# Patient Record
Sex: Female | Born: 1950 | Race: White | Hispanic: No | Marital: Married | Smoking: Former smoker
Health system: Southern US, Community
[De-identification: ages and names within clinical notes are randomized; demographics above are authoritative.]

## PROBLEM LIST (undated history)

## (undated) DIAGNOSIS — I6529 Occlusion and stenosis of unspecified carotid artery: Secondary | ICD-10-CM

## (undated) DIAGNOSIS — E079 Disorder of thyroid, unspecified: Secondary | ICD-10-CM

## (undated) DIAGNOSIS — E785 Hyperlipidemia, unspecified: Secondary | ICD-10-CM

## (undated) DIAGNOSIS — I4891 Unspecified atrial fibrillation: Secondary | ICD-10-CM

## (undated) DIAGNOSIS — I480 Paroxysmal atrial fibrillation: Secondary | ICD-10-CM

## (undated) DIAGNOSIS — F329 Major depressive disorder, single episode, unspecified: Secondary | ICD-10-CM

## (undated) DIAGNOSIS — Z79891 Long term (current) use of opiate analgesic: Secondary | ICD-10-CM

## (undated) DIAGNOSIS — G894 Chronic pain syndrome: Secondary | ICD-10-CM

## (undated) DIAGNOSIS — L02612 Cutaneous abscess of left foot: Secondary | ICD-10-CM

## (undated) DIAGNOSIS — J189 Pneumonia, unspecified organism: Secondary | ICD-10-CM

## (undated) DIAGNOSIS — M5137 Other intervertebral disc degeneration, lumbosacral region: Secondary | ICD-10-CM

## (undated) DIAGNOSIS — I82409 Acute embolism and thrombosis of unspecified deep veins of unspecified lower extremity: Secondary | ICD-10-CM

## (undated) DIAGNOSIS — I10 Essential (primary) hypertension: Secondary | ICD-10-CM

## (undated) DIAGNOSIS — A419 Sepsis, unspecified organism: Secondary | ICD-10-CM

## (undated) DIAGNOSIS — M51379 Other intervertebral disc degeneration, lumbosacral region without mention of lumbar back pain or lower extremity pain: Secondary | ICD-10-CM

## (undated) DIAGNOSIS — Z9289 Personal history of other medical treatment: Secondary | ICD-10-CM

## (undated) DIAGNOSIS — Z9049 Acquired absence of other specified parts of digestive tract: Secondary | ICD-10-CM

## (undated) DIAGNOSIS — M961 Postlaminectomy syndrome, not elsewhere classified: Secondary | ICD-10-CM

## (undated) DIAGNOSIS — G4733 Obstructive sleep apnea (adult) (pediatric): Secondary | ICD-10-CM

## (undated) DIAGNOSIS — I509 Heart failure, unspecified: Secondary | ICD-10-CM

## (undated) DIAGNOSIS — F32A Depression, unspecified: Secondary | ICD-10-CM

## (undated) DIAGNOSIS — M5030 Other cervical disc degeneration, unspecified cervical region: Secondary | ICD-10-CM

## (undated) HISTORY — DX: Paroxysmal atrial fibrillation: I48.0

## (undated) HISTORY — PX: CARDIAC CATHETERIZATION: SHX172

## (undated) HISTORY — DX: Major depressive disorder, single episode, unspecified: F32.9

## (undated) HISTORY — PX: COLONOSCOPY, ESOPHAGOGASTRODUODENOSCOPY (EGD) AND ESOPHAGEAL DILATION: SHX5781

## (undated) HISTORY — PX: TONSILLECTOMY: SUR1361

## (undated) HISTORY — DX: Hyperlipidemia, unspecified: E78.5

## (undated) HISTORY — DX: Long term (current) use of opiate analgesic: Z79.891

## (undated) HISTORY — PX: LUMBAR MICRODISCECTOMY: SHX99

## (undated) HISTORY — DX: Depression, unspecified: F32.A

## (undated) HISTORY — DX: Sepsis, unspecified organism: A41.9

## (undated) HISTORY — DX: Essential (primary) hypertension: I10

## (undated) HISTORY — DX: Obstructive sleep apnea (adult) (pediatric): G47.33

## (undated) HISTORY — DX: Unspecified atrial fibrillation: I48.91

## (undated) HISTORY — PX: COLONOSCOPY WITH ESOPHAGOGASTRODUODENOSCOPY (EGD): SHX5779

## (undated) HISTORY — DX: Other cervical disc degeneration, unspecified cervical region: M50.30

## (undated) HISTORY — DX: Postlaminectomy syndrome, not elsewhere classified: M96.1

## (undated) HISTORY — DX: Acute embolism and thrombosis of unspecified deep veins of unspecified lower extremity: I82.409

## (undated) HISTORY — DX: Acquired absence of other specified parts of digestive tract: Z90.49

## (undated) HISTORY — DX: Other intervertebral disc degeneration, lumbosacral region without mention of lumbar back pain or lower extremity pain: M51.379

## (undated) HISTORY — PX: BREAST LUMPECTOMY: SHX2

## (undated) HISTORY — DX: Disorder of thyroid, unspecified: E07.9

## (undated) HISTORY — DX: Personal history of other medical treatment: Z92.89

## (undated) HISTORY — PX: EVACUATION BREAST HEMATOMA: SHX1537

## (undated) HISTORY — DX: Occlusion and stenosis of unspecified carotid artery: I65.29

## (undated) HISTORY — DX: Cutaneous abscess of left foot: L02.612

## (undated) HISTORY — DX: Heart failure, unspecified: I50.9

## (undated) HISTORY — DX: Chronic pain syndrome: G89.4

## (undated) HISTORY — DX: Other intervertebral disc degeneration, lumbosacral region: M51.37

## (undated) HISTORY — PX: CARDIAC PACEMAKER PLACEMENT: SHX583

## (undated) HISTORY — PX: BACK SURGERY: SHX140

## (undated) HISTORY — DX: Pneumonia, unspecified organism: J18.9

---

## 2007-01-06 ENCOUNTER — Encounter: Admission: RE | Admit: 2007-01-06 | Discharge: 2007-01-06 | Payer: Self-pay | Admitting: Obstetrics and Gynecology

## 2007-01-09 ENCOUNTER — Encounter: Admission: RE | Admit: 2007-01-09 | Discharge: 2007-01-09 | Payer: Self-pay | Admitting: Obstetrics and Gynecology

## 2010-06-04 ENCOUNTER — Encounter: Payer: Self-pay | Admitting: Family Medicine

## 2010-06-04 ENCOUNTER — Encounter: Payer: Self-pay | Admitting: Obstetrics and Gynecology

## 2010-06-04 ENCOUNTER — Encounter: Payer: Self-pay | Admitting: Internal Medicine

## 2016-05-04 ENCOUNTER — Other Ambulatory Visit: Payer: Self-pay | Admitting: Orthopedic Surgery

## 2016-05-04 DIAGNOSIS — M4316 Spondylolisthesis, lumbar region: Secondary | ICD-10-CM

## 2016-05-04 DIAGNOSIS — M961 Postlaminectomy syndrome, not elsewhere classified: Secondary | ICD-10-CM

## 2016-05-14 DIAGNOSIS — I6529 Occlusion and stenosis of unspecified carotid artery: Secondary | ICD-10-CM

## 2016-05-14 HISTORY — DX: Occlusion and stenosis of unspecified carotid artery: I65.29

## 2016-05-18 ENCOUNTER — Other Ambulatory Visit: Payer: Self-pay

## 2016-05-18 ENCOUNTER — Inpatient Hospital Stay: Admission: RE | Admit: 2016-05-18 | Payer: Self-pay | Source: Ambulatory Visit

## 2016-05-28 ENCOUNTER — Other Ambulatory Visit: Payer: Self-pay

## 2016-06-02 ENCOUNTER — Ambulatory Visit
Admission: RE | Admit: 2016-06-02 | Discharge: 2016-06-02 | Disposition: A | Discharge status: Home / Self Care | Payer: Self-pay | Source: Ambulatory Visit | Attending: Orthopedic Surgery | Admitting: Orthopedic Surgery

## 2016-06-02 DIAGNOSIS — M961 Postlaminectomy syndrome, not elsewhere classified: Secondary | ICD-10-CM

## 2016-06-02 DIAGNOSIS — M4316 Spondylolisthesis, lumbar region: Secondary | ICD-10-CM

## 2017-08-16 IMAGING — MR MR LUMBAR SPINE W/O CM
5 series · 40 of 48 positions shown · non-contrast
Comparison: 02/09/2011

CLINICAL DATA: Chronic central and right-sided low back pain
radiating into the right leg to the foot. Prior surgery.

EXAM:
MRI LUMBAR SPINE WITHOUT CONTRAST
TECHNIQUE: Multiplanar, multisequence MR imaging of the lumbar spine was
performed. No intravenous contrast was administered.

[Series 3: T2 · sagittal · 4.0mm · 0.81mm/px · 7 of 14 slices shown (1 of 2)]
[im 1/14]
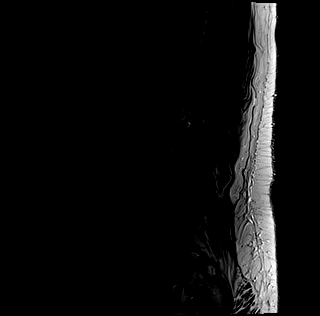
[im 3/14]
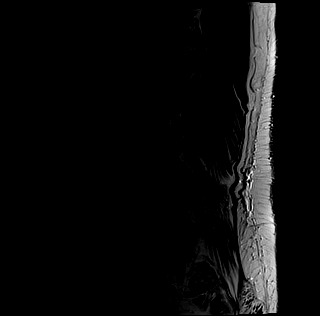
[im 5/14]
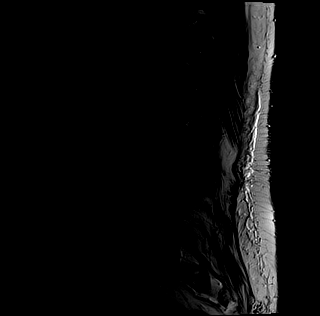
[im 7/14]
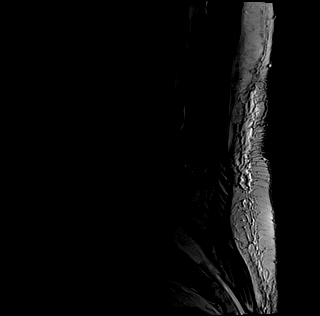
[im 9/14]
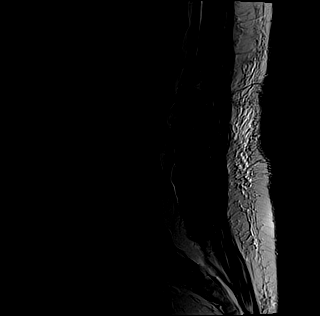
[im 11/14]
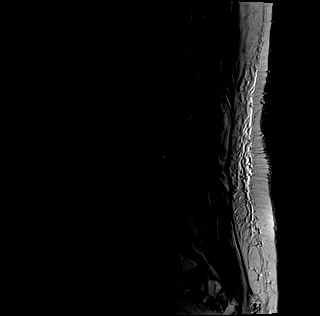
[im 14/14]
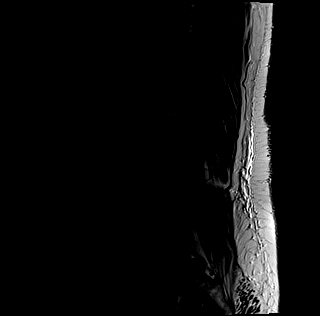

[Series 4: tirm sag · sagittal · 4.0mm · 0.55mm/px · 7 of 14 slices shown]
[im 1/14]
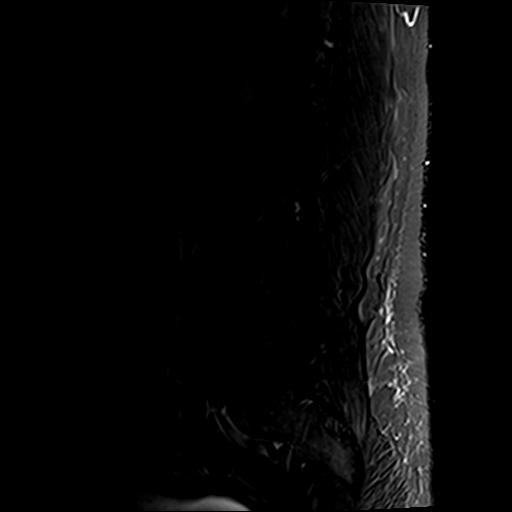
[im 3/14]
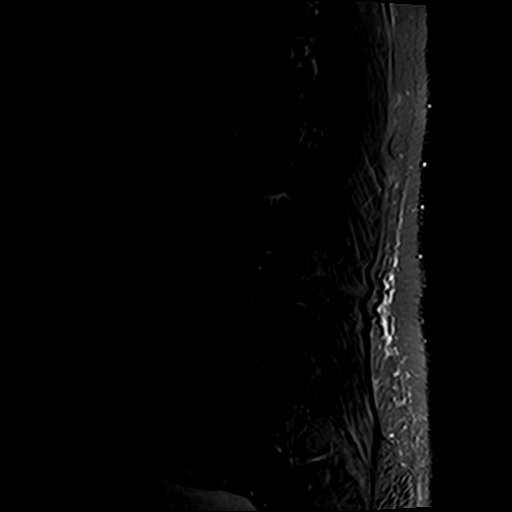
[im 5/14]
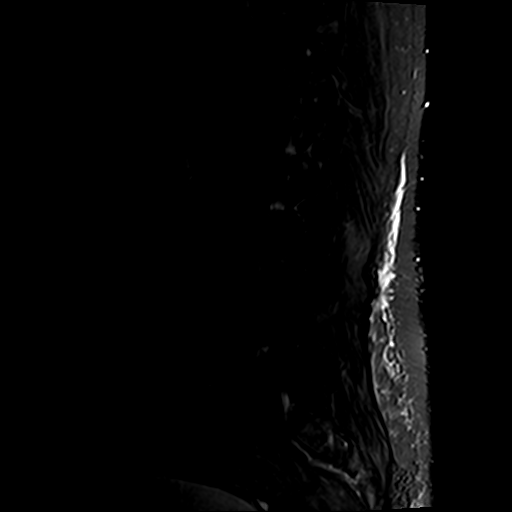
[im 7/14]
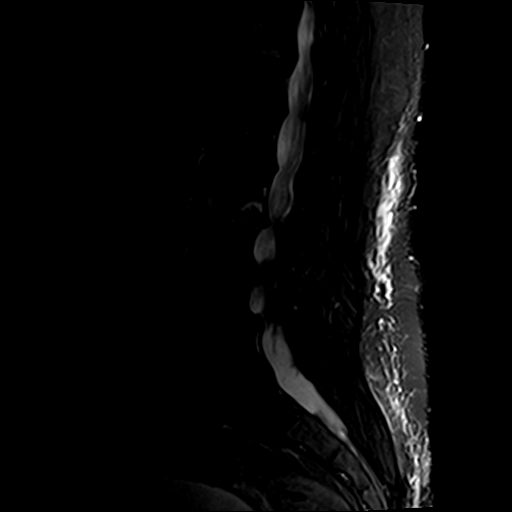
[im 9/14]
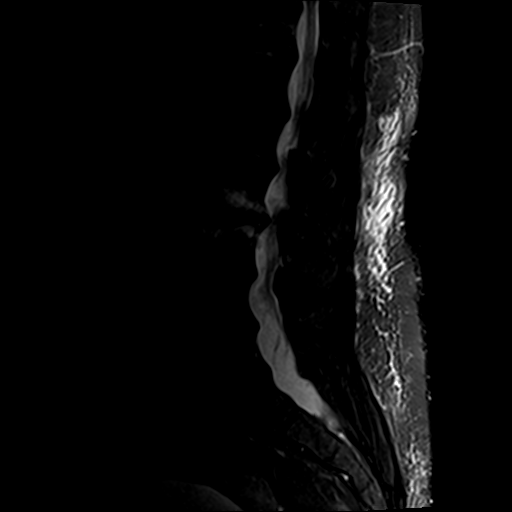
[im 11/14]
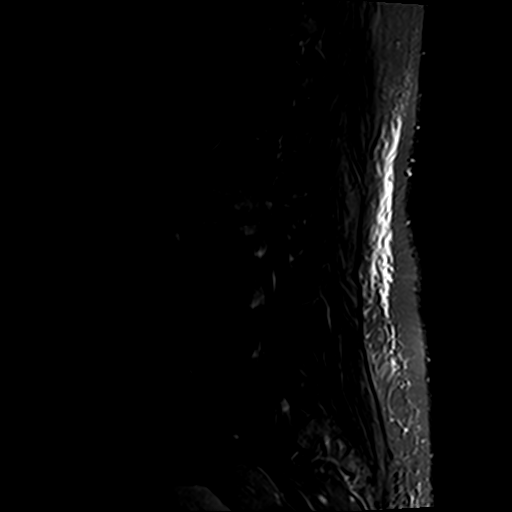
[im 14/14]
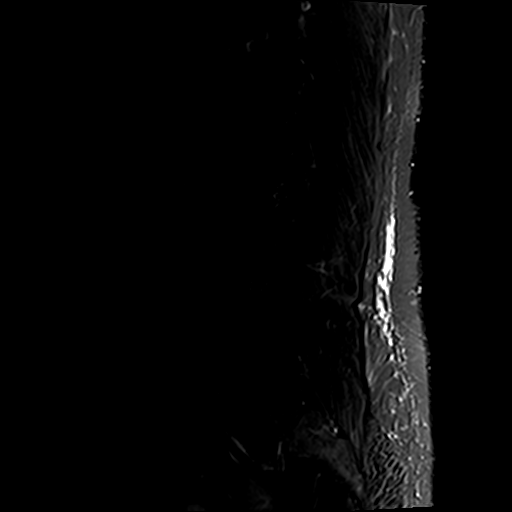

[Series 5: T1 · sagittal · 4.0mm · 0.88mm/px · 6 of 14 slices shown (1 of 2)]
[im 1/14]
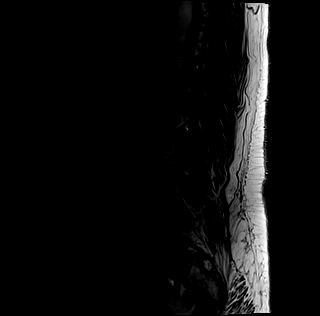
[im 3/14]
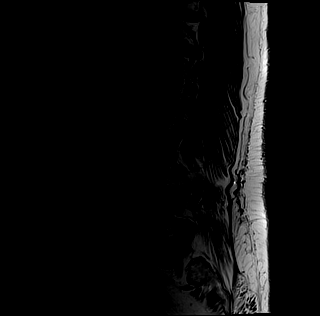
[im 6/14]
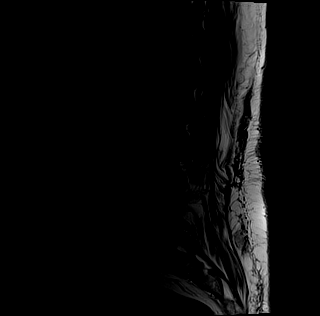
[im 8/14]
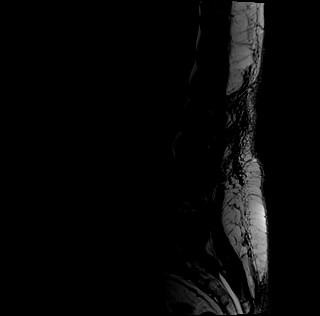
[im 11/14]
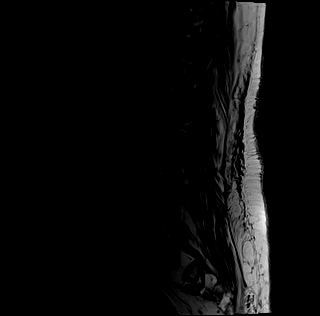
[im 14/14]
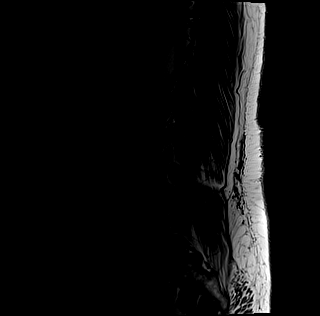

[Series 7: T2 · axial · 4.0mm · 0.70mm/px · z∈[-102,+69]mm · 12 of 31 slices shown (2 of 2)]
[im 1/31]
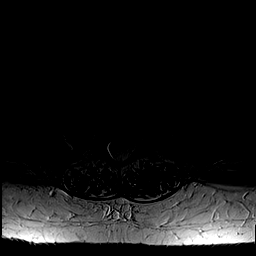
[im 3/31]
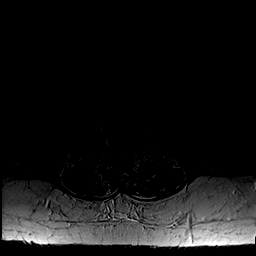
[im 5/31]
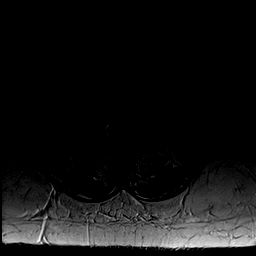
[im 7/31]
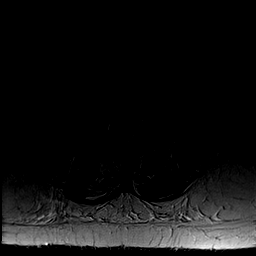
[im 10/31]
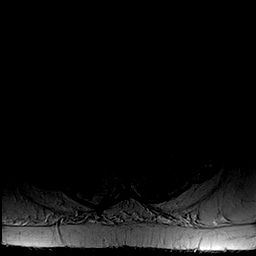
[im 12/31]
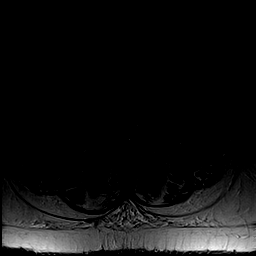
[im 14/31]
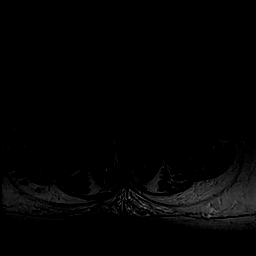
[im 17/31]
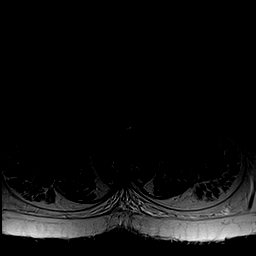
[im 19/31]
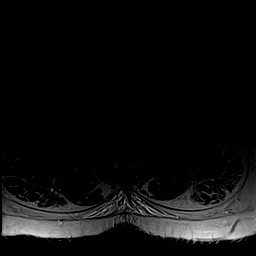
[im 21/31]
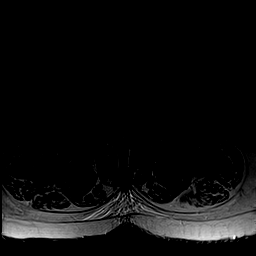
[im 26/31]
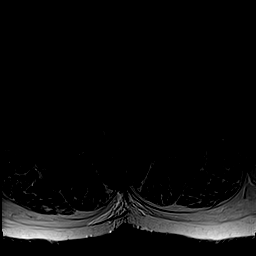
[im 31/31]
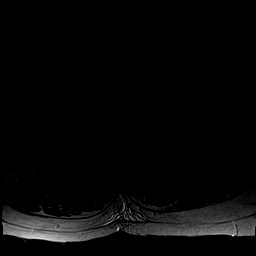

[Series 8: T1 · axial · 4.0mm · 0.70mm/px · z∈[-102,+69]mm · 8 of 31 slices shown (2 of 2)]
[im 1/31]
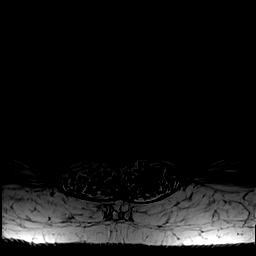
[im 5/31]
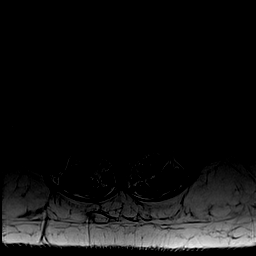
[im 10/31]
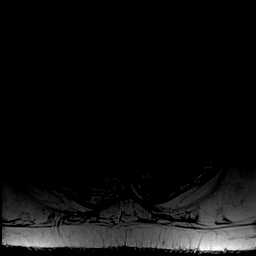
[im 14/31]
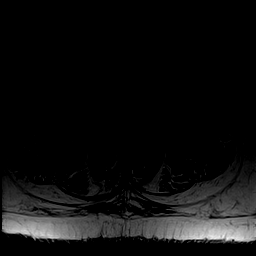
[im 17/31]
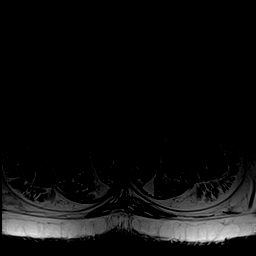
[im 21/31]
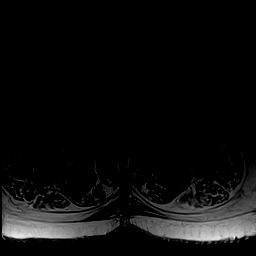
[im 26/31]
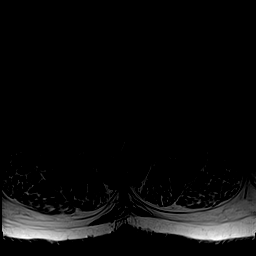
[im 31/31]
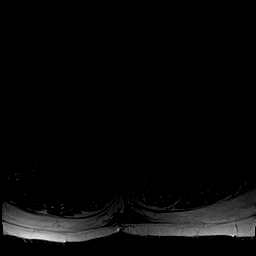

[40 of 48 positions shown; findings below may reference images not displayed]

FINDINGS: Segmentation:  Standard.

Alignment: Trace retrolisthesis of L2 on L3 and L3 on L4 and trace
anterolisthesis of L4 on L5, degenerative in appearance.

Vertebrae: No fracture or suspicious osseous lesion. Progressive
disc degeneration at L2-3 with new moderate degenerative edema.
Predominantly type 2 endplate changes at L4-5. Scattered small
Schmorl's nodes.

Conus medullaris: Extends to the L1 level and appears normal.

Paraspinal and other soft tissues: No acute abnormality.

Disc levels:

Disc desiccation throughout the lumbar spine. Moderate disc space
narrowing at L2-3 and L3-4 and severe narrowing at L4-5.

T11-12 and T12-L1: Only imaged sagittally. Mild disc bulging at both
levels without significant stenosis.

L1-2: Circumferential disc bulging and mild facet arthrosis result
in minimal left neural foraminal narrowing without spinal stenosis,
unchanged.

L2-3: Circumferential disc bulging, endplate spurring, mild facet
and ligamentum flavum hypertrophy, and prominent dorsal epidural fat
result in moderate spinal stenosis and moderate left and mild right
neural foraminal stenosis, overall mildly progressed.

L3-4: Circumferential disc bulging and mild facet hypertrophy result
in mild bilateral lateral recess and mild bilateral neural foraminal
stenosis, not significantly changed.

L4-5: Prior right laminectomy again noted. Disc bulging, endplate
spurring, small right paracentral disc protrusion, and moderate
facet arthrosis result in mild right lateral recess and moderate
right greater than left neural foraminal stenosis, not significantly
changed. No spinal stenosis.

L5-S1: Moderate facet arthrosis without disc herniation or stenosis,
unchanged.
IMPRESSION: 1. Progressive disc degeneration at L2-3 with moderate spinal
stenosis and moderate left neural foraminal stenosis.
2. Unchanged disc and facet degeneration elsewhere with up to
moderate neural foraminal stenosis as above.

## 2018-03-26 ENCOUNTER — Non-Acute Institutional Stay (SKILLED_NURSING_FACILITY): Payer: Medicare HMO | Admitting: Internal Medicine

## 2018-03-26 ENCOUNTER — Encounter: Payer: Self-pay | Admitting: Internal Medicine

## 2018-03-26 DIAGNOSIS — L03032 Cellulitis of left toe: Secondary | ICD-10-CM

## 2018-03-26 DIAGNOSIS — I4891 Unspecified atrial fibrillation: Secondary | ICD-10-CM

## 2018-03-26 DIAGNOSIS — L02612 Cutaneous abscess of left foot: Secondary | ICD-10-CM

## 2018-03-26 DIAGNOSIS — I10 Essential (primary) hypertension: Secondary | ICD-10-CM

## 2018-03-26 DIAGNOSIS — G894 Chronic pain syndrome: Secondary | ICD-10-CM

## 2018-03-26 DIAGNOSIS — K8020 Calculus of gallbladder without cholecystitis without obstruction: Secondary | ICD-10-CM | POA: Diagnosis not present

## 2018-03-26 DIAGNOSIS — E034 Atrophy of thyroid (acquired): Secondary | ICD-10-CM

## 2018-03-26 DIAGNOSIS — E785 Hyperlipidemia, unspecified: Secondary | ICD-10-CM

## 2018-03-26 NOTE — Progress Notes (Signed)
:   Location:  [facility] Room Number: [phone] Place of Service:  SNF (31)  Crystal Gates. Lyn Hollingshead, MD  Extended Emergency Contact Information Primary Emergency Contact: Pooler,Glenn Address: [address]          [place], [place] [address] D[place] of [place] Home Phone: [phone] Mobile Phone: [phone] Relation: Other     Allergies: Patient has no known allergies.  Chief Complaint  Patient presents with  . New Admit To SNF    Admit to [facility]    HPI: Patient is 67 y.o. female with paroxysmal atrial fib, history of tachybradycardia post pacemaker placement in 03/13/2018, history of left foot abscess post I&D on oral antibiotics, history of carotid artery stenosis, chronic back pain, post lumbar laminectomy and fusion who presented to the emergency department with complaints of constant right flank pain that it started the night prior.  She came to the hospital thinking she had a kidney stone.  She denied any chest pain, shortness of breath, any vomiting diarrhea or dysuria.  CT abdomen and pelvis did not show any significant findings, ultrasound of the right upper quadrant showed patient had 2 cm gallstone at gallbladder neck and suspected gallbladder sludge without sonographic findings of acute cholecystitis.  Patient admitted to [facility] from 11/6-12 where she underwent her laparoscopic cholecystectomy.  Since she was n.p.o. and did not take her medication she went into atrial fib with RVR.  She was started on Cardizem drip then metoprolol resumed.  Patient's left foot injury was seen by Ortho who felt the wound had healed and recommended nonweightbearing exercise for 2 more weeks.  Patient is admitted to SNF for OT/PT.  While at SNF patient will be followed for hypertension treated with Cozaar and metoprolol, hyperlipidemia treated with Lipitor and hypothyroidism treated with Synthroid.  Past Medical History:  Diagnosis Date  .  Carotid artery stenosis 2018   Left 40-59%, Right 1-39%  . CHF (congestive heart failure) (HCC)   . Chronic pain syndrome   . Chronic prescription opiate use   . Degeneration of cervical intervertebral disc   . Degeneration of lumbar or lumbosacral intervertebral disc   . Depressive disorder   . DVT (deep venous thrombosis) (HCC)   . Hyperlipidemia   . Hypertension   . OSA (obstructive sleep apnea)    No CPAP  . Paroxysmal atrial fibrillation (HCC)   . Pneumonia   . Postlaminectomy syndrome, lumbar region   . Sepsis (HCC)   . Thyroid disease   . Transfusion history     Past Surgical History:  Procedure Laterality Date  . BACK SURGERY    . CARDIAC CATHETERIZATION    . COLONOSCOPY, ESOPHAGOGASTRODUODENOSCOPY (EGD) AND ESOPHAGEAL DILATION    . LUMBAR MICRODISCECTOMY    . TONSILLECTOMY      Allergies as of 03/26/2018   No Known Allergies     Medication List        Accurate as of 03/26/18  2:07 PM. Always use your most recent med list.          acetaminophen 500 MG tablet Commonly known as:  TYLENOL Take 1,000 mg by mouth every 6 (six) hours as needed.   aspirin 81 MG chewable tablet Chew by mouth daily.   atorvastatin 20 MG tablet Commonly known as:  LIPITOR Take by mouth daily. 1 Tablet (20 mg total) by mouth daily at 6p   desvenlafaxine 50 MG 24 hr tablet Commonly known  as:  PRISTIQ Take 100 mg by mouth at bedtime.   docusate sodium 100 MG capsule Commonly known as:  COLACE Take 100 mg by mouth 2 (two) times daily.   HYDROcodone-acetaminophen 5-325 MG tablet Commonly known as:  NORCO/VICODIN Take 2 tablets by mouth every 6 (six) hours as needed for moderate pain.   levothyroxine 150 MCG tablet Commonly known as:  SYNTHROID, LEVOTHROID Take 150 mcg by mouth daily before breakfast.   losartan 25 MG tablet Commonly known as:  COZAAR Take 12.5 mg by mouth daily.   metoprolol tartrate 25 MG tablet Commonly known as:  LOPRESSOR Take 25 mg by mouth 2  (two) times daily.   morphine 15 MG 12 hr tablet Commonly known as:  MS CONTIN Take 15 mg by mouth every 8 (eight) hours.   pregabalin 200 MG capsule Commonly known as:  LYRICA Take 200 mg by mouth 3 (three) times daily.   rivaroxaban 20 MG Tabs tablet Commonly known as:  XARELTO Take 20 mg by mouth daily with supper.       No orders of the defined types were placed in this encounter.    There is no immunization history on file for this patient.  Social History   Tobacco Use  . Smoking status: Former Games developer  . Smokeless tobacco: Former Engineer, water Use Topics  . Alcohol use: Not on file    Family history is   Family History  Problem Relation Age of Onset  . Breast cancer Mother 32      Review of Systems  DATA OBTAINED: from patient, nurse GENERAL:  no fevers, fatigue, appetite changes SKIN: No itching, or rash EYES: No eye pain, redness, discharge EARS: No earache, tinnitus, change in hearing NOSE: No congestion, drainage or bleeding  MOUTH/THROAT: No mouth or tooth pain, No sore throat RESPIRATORY: No cough, wheezing, SOB CARDIAC: No chest pain, palpitations, lower extremity edema  GI: No abdominal pain, No N/V/D or constipation, No heartburn or reflux  GU: No dysuria, frequency or urgency, or incontinence  MUSCULOSKELETAL: No unrelieved bone/joint pain NEUROLOGIC: No headache, dizziness or focal weakness PSYCHIATRIC: No c/o anxiety or sadness   Vitals:   03/26/18 1405  BP: 133/72  Pulse: 71  Resp: 18  Temp: (!) 97.4 F (36.3 C)    SpO2 Readings from Last 1 Encounters:  No data found for SpO2   Body mass index is 35.19 kg/m.     Physical Exam  GENERAL APPEARANCE: Alert, conversant,  No acute distress.  SKIN: No diaphoresis rash HEAD: Normocephalic, atraumatic  EYES: Conjunctiva/lids clear. Pupils round, reactive. EOMs intact.  EARS: External exam WNL, canals clear. Hearing grossly normal.  NOSE: No deformity or discharge.    MOUTH/THROAT: Lips w/o lesions  RESPIRATORY: Breathing is even, unlabored. Lung sounds are clear   CARDIOVASCULAR: Heart RRR no murmurs, rubs or gallops. No peripheral edema.   GASTROINTESTINAL: Abdomen is soft, mild right upper quadrant tenderness, incision areas look clean, tender, not distended w/ normal bowel sounds. GENITOURINARY: Bladder non tender, not distended  MUSCULOSKELETAL: No abnormal joints or musculature NEUROLOGIC:  Cranial nerves 2-12 grossly intact. Moves all extremities  PSYCHIATRIC: Mood and affect appropriate to situation, no behavioral issues  There are no active problems to display for this patient.     Labs reviewed: Basic Metabolic Panel: No results found for: NA, K, CL, CO2, GLUCOSE, BUN, CREATININE, CALCIUM, PROT, ALBUMIN, AST, ALT, ALKPHOS, BILITOT, GFRNONAA, GFRAA  No results for input(s): NA, K, CL, CO2, GLUCOSE, BUN,  CREATININE, CALCIUM, MG, PHOS in the last 8760 hours. Liver Function Tests: No results for input(s): AST, ALT, ALKPHOS, BILITOT, PROT, ALBUMIN in the last 8760 hours. No results for input(s): LIPASE, AMYLASE in the last 8760 hours. No results for input(s): AMMONIA in the last 8760 hours. CBC: No results for input(s): WBC, NEUTROABS, HGB, HCT, MCV, PLT in the last 8760 hours. Lipid No results for input(s): CHOL, HDL, LDLCALC, TRIG in the last 8760 hours.  Cardiac Enzymes: No results for input(s): CKTOTAL, CKMB, CKMBINDEX, TROPONINI in the last 8760 hours. BNP: No results for input(s): BNP in the last 8760 hours. No results found for: MICROALBUR No results found for: HGBA1C No results found for: TSH No results found for: VITAMINB12 No results found for: FOLATE No results found for: IRON, TIBC, FERRITIN  Imaging and Procedures obtained prior to SNF admission: Mr Thoracic Spine Wo Contrast  Result Date: 06/02/2016 CLINICAL DATA:  Thoracic post-laminectomy syndrome. Worsening low back pain. EXAM: MRI THORACIC SPINE WITHOUT CONTRAST  TECHNIQUE: Multiplanar, multisequence MR imaging of the thoracic spine was performed. No intravenous contrast was administered. COMPARISON:  Chest CT 09/29/2014.  CT abdomen and pelvis 12/27/2014. FINDINGS: Alignment: Slight S-shaped thoracic scoliosis, convex left in the lower thoracic spine. No listhesis. Vertebrae: No evidence of fracture, osseous lesion, or significant marrow edema. Multiple small Schmorl's nodes are present throughout the mid and lower thoracic spine. Cord:  Normal signal and morphology. Paraspinal and other soft tissues: 2 cm stone in the gallbladder common bile duct dilatation up to 12 mm, incompletely visualized and potentially mildly increased compared to the prior abdominal CT. Disc levels: Partially visualized disc degeneration at C6-7 with broad-based posterior disc osteophyte complex resulting in likely mild spinal stenosis. There is mild disc bulging from T5-6 to T12-L1, greatest at T12-L1 where there is minimal left lateral recess narrowing without spinal stenosis, neural foraminal stenosis, or evidence of neural impingement. Spinal canal and neural foramina are widely patent elsewhere in the thoracic spine. IMPRESSION: Mild thoracic spondylosis without evidence of neural impingement. Electronically Signed   By: Sebastian AcheAllen  Grady M.D.   On: 06/02/2016 18:52   Mr Lumbar Spine Wo Contrast  Result Date: 06/02/2016 CLINICAL DATA:  Chronic central and right-sided low back pain radiating into the right leg to the foot. Prior surgery. EXAM: MRI LUMBAR SPINE WITHOUT CONTRAST TECHNIQUE: Multiplanar, multisequence MR imaging of the lumbar spine was performed. No intravenous contrast was administered. COMPARISON:  02/09/2011 FINDINGS: Segmentation:  Standard. Alignment: Trace retrolisthesis of L2 on L3 and L3 on L4 and trace anterolisthesis of L4 on L5, degenerative in appearance. Vertebrae: No fracture or suspicious osseous lesion. Progressive disc degeneration at L2-3 with new moderate  degenerative edema. Predominantly type 2 endplate changes at L4-5. Scattered small Schmorl's nodes. Conus medullaris: Extends to the L1 level and appears normal. Paraspinal and other soft tissues: No acute abnormality. Disc levels: Disc desiccation throughout the lumbar spine. Moderate disc space narrowing at L2-3 and L3-4 and severe narrowing at L4-5. T11-12 and T12-L1: Only imaged sagittally. Mild disc bulging at both levels without significant stenosis. L1-2: Circumferential disc bulging and mild facet arthrosis result in minimal left neural foraminal narrowing without spinal stenosis, unchanged. L2-3: Circumferential disc bulging, endplate spurring, mild facet and ligamentum flavum hypertrophy, and prominent dorsal epidural fat result in moderate spinal stenosis and moderate left and mild right neural foraminal stenosis, overall mildly progressed. L3-4: Circumferential disc bulging and mild facet hypertrophy result in mild bilateral lateral recess and mild bilateral neural foraminal stenosis, not  significantly changed. L4-5: Prior right laminectomy again noted. Disc bulging, endplate spurring, small right paracentral disc protrusion, and moderate facet arthrosis result in mild right lateral recess and moderate right greater than left neural foraminal stenosis, not significantly changed. No spinal stenosis. L5-S1: Moderate facet arthrosis without disc herniation or stenosis, unchanged. IMPRESSION: 1. Progressive disc degeneration at L2-3 with moderate spinal stenosis and moderate left neural foraminal stenosis. 2. Unchanged disc and facet degeneration elsewhere with up to moderate neural foraminal stenosis as above. Electronically Signed   By: Sebastian Ache M.D.   On: 06/02/2016 18:46     Not all labs, radiology exams or other studies done during hospitalization come through on my EPIC note; however they are reviewed by me.    Assessment and Plan  Impacted gallstone- status post cholecystectomy; doing  well SNF- admitted for OT/PT  Left foot infection- status post I&D with improvement of cellulitis after 3 weeks of Omnicef ending on 11/9; patient did continue 2 weeks of nonweightbearing SNF- will be followed by PT with 2 weeks of nonweightbearing left foot  Atrial fibrillation with RVR- secondary to n.p.o. status without meds; treated with diltiazem drip and then back to metoprolol SNF- continue metoprolol 25 mg twice daily and Xarelto 20 mg daily  Hypertension SNF-controlled continue Cozaar 12.5 mg daily and metoprolol 25 mg twice daily  Hyperlipidemia SNF- not stated as uncontrolled; continue Lipitor 20 mg daily  Hypothyroidism SNF- not stated as uncontrolled continue Synthroid 150 mcg daily  Chronic pain syndrome- patient was started on morphine extended release 15 mg every 8 hours with Norco 10 mg every 6 for breakthrough pain SNF- continue above regimen; patient should follow-up pain clinic on discharge   Time spent greater than 45 minutes;> 50% of time with patient was spent reviewing records, labs, tests and studies, counseling and developing plan of care  Thurston Hole D. Lyn Hollingshead, MD

## 2018-03-29 ENCOUNTER — Encounter: Payer: Self-pay | Admitting: Internal Medicine

## 2018-03-29 DIAGNOSIS — K8020 Calculus of gallbladder without cholecystitis without obstruction: Secondary | ICD-10-CM | POA: Insufficient documentation

## 2018-03-29 DIAGNOSIS — G894 Chronic pain syndrome: Secondary | ICD-10-CM | POA: Insufficient documentation

## 2018-03-29 DIAGNOSIS — I4891 Unspecified atrial fibrillation: Secondary | ICD-10-CM | POA: Insufficient documentation

## 2018-03-29 DIAGNOSIS — L02612 Cutaneous abscess of left foot: Secondary | ICD-10-CM | POA: Insufficient documentation

## 2018-03-29 DIAGNOSIS — E785 Hyperlipidemia, unspecified: Secondary | ICD-10-CM | POA: Insufficient documentation

## 2018-03-29 DIAGNOSIS — L03032 Cellulitis of left toe: Secondary | ICD-10-CM

## 2018-03-29 DIAGNOSIS — E039 Hypothyroidism, unspecified: Secondary | ICD-10-CM | POA: Insufficient documentation

## 2018-03-29 DIAGNOSIS — I10 Essential (primary) hypertension: Secondary | ICD-10-CM | POA: Insufficient documentation

## 2018-03-31 LAB — BASIC METABOLIC PANEL
BUN: 12 (ref 4–21)
CREATININE: 0.7 (ref 0.5–1.1)
Glucose: 96
POTASSIUM: 4.2 (ref 3.4–5.3)
SODIUM: 141 (ref 137–147)

## 2018-03-31 LAB — CBC AND DIFFERENTIAL
HEMATOCRIT: 34 — AB (ref 36–46)
Hemoglobin: 11.7 — AB (ref 12.0–16.0)
Platelets: 169 (ref 150–399)
WBC: 5.7

## 2018-04-04 ENCOUNTER — Encounter: Payer: Self-pay | Admitting: Internal Medicine

## 2018-04-04 ENCOUNTER — Non-Acute Institutional Stay (SKILLED_NURSING_FACILITY): Payer: Medicare HMO | Admitting: Internal Medicine

## 2018-04-04 ENCOUNTER — Other Ambulatory Visit: Payer: Self-pay | Admitting: Internal Medicine

## 2018-04-04 DIAGNOSIS — E785 Hyperlipidemia, unspecified: Secondary | ICD-10-CM

## 2018-04-04 DIAGNOSIS — K8020 Calculus of gallbladder without cholecystitis without obstruction: Secondary | ICD-10-CM

## 2018-04-04 DIAGNOSIS — L03032 Cellulitis of left toe: Secondary | ICD-10-CM

## 2018-04-04 DIAGNOSIS — L02612 Cutaneous abscess of left foot: Secondary | ICD-10-CM

## 2018-04-04 DIAGNOSIS — I4891 Unspecified atrial fibrillation: Secondary | ICD-10-CM

## 2018-04-04 DIAGNOSIS — G894 Chronic pain syndrome: Secondary | ICD-10-CM

## 2018-04-04 DIAGNOSIS — I10 Essential (primary) hypertension: Secondary | ICD-10-CM

## 2018-04-04 DIAGNOSIS — E034 Atrophy of thyroid (acquired): Secondary | ICD-10-CM

## 2018-04-04 MED ORDER — ATORVASTATIN CALCIUM 20 MG PO TABS: 20.0000 mg | ORAL_TABLET | Freq: Every day | ORAL | 0 refills | Status: DC

## 2018-04-04 MED ORDER — ASPIRIN 81 MG PO CHEW: 81.0000 mg | CHEWABLE_TABLET | Freq: Every day | ORAL | 0 refills | Status: AC

## 2018-04-04 MED ORDER — RIVAROXABAN 20 MG PO TABS: 20.0000 mg | ORAL_TABLET | Freq: Every day | ORAL | 0 refills | Status: DC

## 2018-04-04 MED ORDER — DESVENLAFAXINE SUCCINATE ER 50 MG PO TB24: 100.0000 mg | ORAL_TABLET | Freq: Every day | ORAL | 0 refills | Status: DC

## 2018-04-04 MED ORDER — METOPROLOL TARTRATE 25 MG PO TABS: 25.0000 mg | ORAL_TABLET | Freq: Two times a day (BID) | ORAL | 0 refills | Status: DC

## 2018-04-04 MED ORDER — MORPHINE SULFATE ER 15 MG PO TBCR: 15.0000 mg | EXTENDED_RELEASE_TABLET | Freq: Three times a day (TID) | ORAL | 0 refills | Status: DC

## 2018-04-04 MED ORDER — LOSARTAN POTASSIUM 25 MG PO TABS: 12.5000 mg | ORAL_TABLET | Freq: Every day | ORAL | 0 refills | Status: DC

## 2018-04-04 MED ORDER — LEVOTHYROXINE SODIUM 150 MCG PO TABS: 150.0000 ug | ORAL_TABLET | Freq: Every day | ORAL | 0 refills | Status: DC

## 2018-04-04 MED ORDER — PREGABALIN 200 MG PO CAPS: 200.0000 mg | ORAL_CAPSULE | Freq: Three times a day (TID) | ORAL | 0 refills | Status: DC

## 2018-04-04 NOTE — Progress Notes (Signed)
Location:  [facility] Room Number: [phone] Place of Service:  SNF [phone])  Randon Goldsmith. Lyn Hollingshead, MD  Patient Care Team: Margit Hanks, MD as PCP - General (Internal Medicine)  Extended Emergency Contact Information Primary Emergency Contact: [facility] Address: [address]          [place], [place] [address] [place] of [place] Home Phone: [phone] Mobile Phone: [phone] Relation: Spouse Secondary Emergency Contact: Kruge,Heather Mobile Phone: [phone] Relation: Daughter  No Known Allergies  Chief Complaint  Patient presents with  . Discharge Note    Discharge from [facility]    HPI:  67 y.o. female with paroxysmal atrial fib, history of tachybradycardia status post pacemaker placement on 03/13/2018 history of left foot abscess status post I&D on oral antibiotics, history of carotid artery stenosis, chronic back pain, status post lumbar laminectomy and fusion who presented to the emergency department with complaints of constant right flank pain that had started the night prior she came to the hospital thinking she had a kidney stone.  She denied any chest pain, shortness of breath, any vomiting or diarrhea or dysuria.  CT abdomen and pelvis did not show any significant findings, ultrasound of the right upper quadrant showed patient had a 2 cm gallstone at gallbladder neck and suspected gallbladder sludge without findings of acute cholecystitis.  Patient was admitted to [facility] from 11/6-12 where she underwent a laparoscopic cholecystectomy.  Since she was n.p.o. she did not take her medication and she went to atrial flutter with RVR.  She was started on Cardizem drip then metoprolol was resumed.  Patient's left foot injury was seen by Ortho who felt the wound had healed and recommended nonweight bearing exercise for 2 more weeks.  Patient was admitted to skilled nursing facility for OT/PT and is now ready to be  discharged home.    Past Medical History:  Diagnosis Date  . Carotid artery stenosis 2018   Left 40-59%, Right 1-39%  . CHF (congestive heart failure) (HCC)   . Chronic pain syndrome   . Chronic prescription opiate use   . Degeneration of cervical intervertebral disc   . Degeneration of lumbar or lumbosacral intervertebral disc   . Depressive disorder   . DVT (deep venous thrombosis) (HCC)   . Hyperlipidemia   . Hypertension   . OSA (obstructive sleep apnea)    No CPAP  . Paroxysmal atrial fibrillation (HCC)   . Pneumonia   . Postlaminectomy syndrome, lumbar region   . Sepsis (HCC)   . Thyroid disease   . Transfusion history     Past Surgical History:  Procedure Laterality Date  . BACK SURGERY    . CARDIAC CATHETERIZATION    . COLONOSCOPY, ESOPHAGOGASTRODUODENOSCOPY (EGD) AND ESOPHAGEAL DILATION    . LUMBAR MICRODISCECTOMY    . TONSILLECTOMY       reports that she has quit smoking. She has quit using smokeless tobacco. Her alcohol and drug histories are not on file. Social History   Socioeconomic History  . Marital status: Married    Spouse name: Not on file  . Number of children: Not on file  . Years of education: Not on file  . Highest education level: Not on file  Occupational History  . Not on file  Social Needs  . Financial resource strain: Not on file  . Food insecurity:    Worry: Not on file    Inability: Not on file  .  Transportation needs:    Medical: Not on file    Non-medical: Not on file  Tobacco Use  . Smoking status: Former Games developer  . Smokeless tobacco: Former Engineer, water and Sexual Activity  . Alcohol use: Not on file  . Drug use: Not on file  . Sexual activity: Not on file  Lifestyle  . Physical activity:    Days per week: Not on file    Minutes per session: Not on file  . Stress: Not on file  Relationships  . Social connections:    Talks on phone: Not on file    Gets together: Not on file    Attends religious service: Not on  file    Active member of club or organization: Not on file    Attends meetings of clubs or organizations: Not on file    Relationship status: Not on file  . Intimate partner violence:    Fear of current or ex partner: Not on file    Emotionally abused: Not on file    Physically abused: Not on file    Forced sexual activity: Not on file  Other Topics Concern  . Not on file  Social History Narrative  . Not on file    Pertinent  Health Maintenance Due  Topic Date Due  . COLONOSCOPY  02/24/2000  . MAMMOGRAM  01/08/2009  . DEXA SCAN  02/24/2015  . PNA vac Low Risk Adult (1 of 2 - PCV13) 02/24/2015  . INFLUENZA VACCINE  12/12/2017    Medications: Allergies as of 04/04/2018   No Known Allergies     Medication List        Accurate as of 04/04/18 12:39 PM. Always use your most recent med list.          acetaminophen 500 MG tablet Commonly known as:  TYLENOL Take 1,000 mg by mouth every 6 (six) hours as needed.   aspirin 81 MG chewable tablet Chew by mouth daily.   atorvastatin 20 MG tablet Commonly known as:  LIPITOR Take by mouth daily. 1 Tablet (20 mg total) by mouth daily at 6p   desvenlafaxine 50 MG 24 hr tablet Commonly known as:  PRISTIQ Take 100 mg by mouth at bedtime.   levothyroxine 150 MCG tablet Commonly known as:  SYNTHROID, LEVOTHROID Take 150 mcg by mouth daily before breakfast.   losartan 25 MG tablet Commonly known as:  COZAAR Take 12.5 mg by mouth daily.   metoprolol tartrate 25 MG tablet Commonly known as:  LOPRESSOR Take 25 mg by mouth 2 (two) times daily.   morphine 15 MG 12 hr tablet Commonly known as:  MS CONTIN Take 15 mg by mouth every 8 (eight) hours.   pregabalin 200 MG capsule Commonly known as:  LYRICA Take 200 mg by mouth 3 (three) times daily.   rivaroxaban 20 MG Tabs tablet Commonly known as:  XARELTO Take 20 mg by mouth daily with supper.        Vitals:   04/04/18 1150  BP: (!) 149/68  Pulse: 71  Resp: 18    Temp: (!) 97.3 F (36.3 C)  Weight: 206 lb (93.4 kg)  Height: 5\' 4"  (1.626 m)   Body mass index is 35.36 kg/m.  Physical Exam  GENERAL APPEARANCE: Alert, conversant. No acute distress.  HEENT: Unremarkable. RESPIRATORY: Breathing is even, unlabored. Lung sounds are clear   CARDIOVASCULAR: Heart RRR no murmurs, rubs or gallops. No peripheral edema.  GASTROINTESTINAL: Abdomen is soft, non-tender, not distended w/  normal bowel sounds.  NEUROLOGIC: Cranial nerves 2-12 grossly intact. Moves all extremities   Labs reviewed: Basic Metabolic Panel: Recent Labs    03/31/18  NA 141  K 4.2  BUN 12  CREATININE 0.7   No results found for: [facility] Liver Function Tests: No results for input(s): AST, ALT, ALKPHOS, BILITOT, PROT, ALBUMIN in the last 8760 hours. No results for input(s): LIPASE, AMYLASE in the last 8760 hours. No results for input(s): AMMONIA in the last 8760 hours. CBC: Recent Labs    03/31/18  WBC 5.7  HGB 11.7*  HCT 34*  PLT 169   Lipid No results for input(s): CHOL, HDL, LDLCALC, TRIG in the last 8760 hours. Cardiac Enzymes: No results for input(s): CKTOTAL, CKMB, CKMBINDEX, TROPONINI in the last 8760 hours. BNP: No results for input(s): BNP in the last 8760 hours. CBG: No results for input(s): GLUCAP in the last 8760 hours.  Procedures and Imaging Studies During Stay: No results found.  Assessment/Plan:   No diagnosis found.   Patient is being discharged with the following home health services: OT/PT/nursing  Patient is being discharged with the following durable medical equipment: None  Patient has been advised to f/u with their PCP in 1-2 weeks to bring them up to date on their rehab stay.  Social services at facility was responsible for arranging this appointment.  Pt was provided with a 30 day supply of prescriptions for medications and refills must be obtained from their PCP.  For controlled substances, a more limited supply may be provided  adequate until PCP appointment only.  Medications have been reconciled.  Time spent greater than 30 minutes . Randon GoldsmithAnne D. Lyn HollingsheadAlexander, MD

## 2018-04-05 ENCOUNTER — Encounter: Payer: Self-pay | Admitting: Internal Medicine

## 2018-04-21 ENCOUNTER — Other Ambulatory Visit: Payer: Self-pay | Admitting: Internal Medicine

## 2018-07-17 ENCOUNTER — Other Ambulatory Visit: Payer: Self-pay | Admitting: Internal Medicine

## 2018-07-17 MED ORDER — MORPHINE SULFATE ER 15 MG PO TBCR: 15.0000 mg | EXTENDED_RELEASE_TABLET | Freq: Three times a day (TID) | ORAL | 0 refills | Status: DC

## 2018-07-17 MED ORDER — HYDROCODONE-ACETAMINOPHEN 10-325 MG PO TABS: 1.0000 | ORAL_TABLET | ORAL | 0 refills | Status: DC | PRN

## 2018-07-18 ENCOUNTER — Encounter: Payer: Self-pay | Admitting: Internal Medicine

## 2018-07-18 ENCOUNTER — Non-Acute Institutional Stay (SKILLED_NURSING_FACILITY): Payer: Medicare HMO | Admitting: Internal Medicine

## 2018-07-18 DIAGNOSIS — I480 Paroxysmal atrial fibrillation: Secondary | ICD-10-CM | POA: Diagnosis not present

## 2018-07-18 DIAGNOSIS — F32A Depression, unspecified: Secondary | ICD-10-CM

## 2018-07-18 DIAGNOSIS — I4891 Unspecified atrial fibrillation: Secondary | ICD-10-CM | POA: Diagnosis not present

## 2018-07-18 DIAGNOSIS — E034 Atrophy of thyroid (acquired): Secondary | ICD-10-CM

## 2018-07-18 DIAGNOSIS — E785 Hyperlipidemia, unspecified: Secondary | ICD-10-CM

## 2018-07-18 DIAGNOSIS — S82841D Displaced bimalleolar fracture of right lower leg, subsequent encounter for closed fracture with routine healing: Secondary | ICD-10-CM | POA: Diagnosis not present

## 2018-07-18 DIAGNOSIS — F329 Major depressive disorder, single episode, unspecified: Secondary | ICD-10-CM

## 2018-07-18 DIAGNOSIS — N3000 Acute cystitis without hematuria: Secondary | ICD-10-CM

## 2018-07-18 LAB — BASIC METABOLIC PANEL
BUN: 10 (ref 4–21)
Creatinine: 0.5 (ref 0.5–1.1)
Glucose: 97
Potassium: 4.3 (ref 3.4–5.3)
Sodium: 144 (ref 137–147)

## 2018-07-18 LAB — CBC AND DIFFERENTIAL
HEMATOCRIT: 36 (ref 36–46)
HEMOGLOBIN: 12.3 (ref 12.0–16.0)
PLATELETS: 189 (ref 150–399)
WBC: 6

## 2018-07-18 NOTE — Progress Notes (Signed)
:  Location:  [facility] Room Number: [phone] Place of Service:  SNF ([id number])  Randon Goldsmith. Lyn Hollingshead, MD  Patient Care Team: Margit Hanks, MD as PCP - General (Internal Medicine)  Extended Emergency Contact Information Primary Emergency Contact: Nuzzo,Glenn Address: [address]          [place], [place] [address] D[place] of [place] Home Phone: [phone] Mobile Phone: [phone] Relation: Spouse Secondary Emergency Contact: Kruge,Heather Mobile Phone: [phone] Relation: Daughter     Allergies: Patient has no known allergies.  Chief Complaint  Patient presents with  . New Admit To SNF    Admit to [facility]    HPI: Patient is 68 y.o. female with hypothyroidism, chronic pain syndrome, essential hypertension, paroxysmal atrial fib on Xarelto for the last 4 years, CHF, cardiac pacemaker, recently diagnosed left breast cancer, planning to start radiation therapy who presented to [facility] emergency department status post fall and found to have a right ankle fracture.  Patient reports feeling dizzy in the morning, got up too fast started walking got dizzy and fell injuring her right ankle.  She denies loss of consciousness and since she was on the ground her dizziness improved she denies any numbness tingling weakness to the extremity.  She denies any head injury or any other injuries in the ankle.  She reports taking chronic pain meds that include morphine 50 mg every 8 hours and hydrocodone as needed from a pain clinic.  In the emergency department her heart rate was 110 blood pressure 138/108 respiratory rate 20, denies shortness of breath chest pain.  EKG was consistent with atrial flutter with variable AV block no new changes.  X-ray showed a right ankle bimalleolar fracture displaced with 6 to 7 mm of the lateral talar subluxation.  Patient was admitted to [facility] from 2/29-3/5 where she underwent an ORIF on  3/2.  During hospitalization she had an episode of atrial fib with RVR and low blood pressure patient has been on Lopressor and losartan at home and cardiology was consulted and Cardizem was added and losartan was stopped.  On 3-4 patient complained of increased urination and UA showed trace leukocyte esterase.  Patient was started on Rocephin then transition to [facility], will follow-up cultures to decide if this should continue.  Patient is admitted to skilled nursing facility for OT/PT.  While at skilled nursing facility patient will be followed for hyperlipidemia treated with Lipitor depression treated with Pristiq and hypothyroidism treated with replacement.  Past Medical History:  Diagnosis Date  . Abscess of left foot   . Atrial fibrillation with RVR (HCC)   . Carotid artery stenosis 2018   Left 40-59%, Right 1-39%  . CHF (congestive heart failure) (HCC)   . Chronic pain syndrome   . Chronic prescription opiate use   . Degeneration of cervical intervertebral disc   . Degeneration of lumbar or lumbosacral intervertebral disc   . Depressive disorder   . DVT (deep venous thrombosis) (HCC)   . Hyperlipidemia   . Hypertension   . OSA (obstructive sleep apnea)    No CPAP  . Paroxysmal atrial fibrillation (HCC)   . Pneumonia   . Postlaminectomy syndrome, lumbar region   . Sepsis (HCC)   . Status post laparoscopic cholecystectomy   . Thyroid disease   . Transfusion history     Past Surgical History:  Procedure Laterality Date  . BACK SURGERY    .  BREAST LUMPECTOMY    . CARDIAC CATHETERIZATION    . CARDIAC CATHETERIZATION    . CARDIAC PACEMAKER PLACEMENT    . COLONOSCOPY WITH ESOPHAGOGASTRODUODENOSCOPY (EGD)    . COLONOSCOPY, ESOPHAGOGASTRODUODENOSCOPY (EGD) AND ESOPHAGEAL DILATION    . EVACUATION BREAST HEMATOMA    . LUMBAR MICRODISCECTOMY    . TONSILLECTOMY      Allergies as of 07/18/2018   No Known Allergies     Medication List       Accurate as of July 18, 2018  1:37  PM. Always use your most recent med list.        acetaminophen 500 MG tablet Commonly known as:  TYLENOL Take 1,000 mg by mouth every 6 (six) hours as needed.   aspirin 81 MG chewable tablet Chew 1 tablet (81 mg total) by mouth daily.   atorvastatin 20 MG tablet Commonly known as:  LIPITOR Take 1 tablet (20 mg total) by mouth daily. 1 Tablet (20 mg total) by mouth daily at 6p   cefdinir 300 MG capsule Commonly known as:  OMNICEF Take 300 mg by mouth 2 (two) times daily.   desvenlafaxine 50 MG 24 hr tablet Commonly known as:  PRISTIQ Take 2 tablets (100 mg total) by mouth at bedtime.   diltiazem 60 MG tablet Commonly known as:  CARDIZEM Take 60 mg by mouth every 6 (six) hours.   docusate sodium 100 MG capsule Commonly known as:  COLACE Take 100 mg by mouth 2 (two) times daily.   gabapentin 800 MG tablet Commonly known as:  NEURONTIN Take 800 mg by mouth 3 (three) times daily.   HYDROcodone-acetaminophen 10-325 MG tablet Commonly known as:  NORCO Take 1 tablet by mouth every 4 (four) hours as needed (for 2 weeks only).   levothyroxine 150 MCG tablet Commonly known as:  SYNTHROID, LEVOTHROID Take 1 tablet (150 mcg total) by mouth daily before breakfast.   metoprolol tartrate 25 MG tablet Commonly known as:  LOPRESSOR Take 1 tablet (25 mg total) by mouth 2 (two) times daily.   morphine 15 MG 12 hr tablet Commonly known as:  MS CONTIN Take 1 tablet (15 mg total) by mouth every 8 (eight) hours.   rivaroxaban 20 MG Tabs tablet Commonly known as:  XARELTO Take 1 tablet (20 mg total) by mouth daily with supper.   tiZANidine 4 MG tablet Commonly known as:  ZANAFLEX Take 4 mg by mouth at bedtime.       No orders of the defined types were placed in this encounter.    There is no immunization history on file for this patient.  Social History   Tobacco Use  . Smoking status: Former Smoker    Packs/day: 1.00    Years: 40.00    Pack years: 40.00  . Smokeless  tobacco: Former Engineer, water Use Topics  . Alcohol use: Not on file    Family history is   Family History  Problem Relation Age of Onset  . Breast cancer Mother 48  . Cancer Mother   . Hypertension Father   . Cancer Sister       Review of Systems  DATA OBTAINED: from patient, nurse GENERAL:  no fevers, fatigue, appetite changes SKIN: No itching, or rash EYES: No eye pain, redness, discharge EARS: No earache, tinnitus, change in hearing NOSE: No congestion, drainage or bleeding  MOUTH/THROAT: No mouth or tooth pain, No sore throat RESPIRATORY: No cough, wheezing, SOB CARDIAC: No chest pain, palpitations, lower extremity edema  GI: No abdominal pain, No N/V/D or constipation, No heartburn or reflux  GU: No dysuria, frequency or urgency, or incontinence  MUSCULOSKELETAL: No unrelieved bone/joint pain NEUROLOGIC: No headache, dizziness or focal weakness PSYCHIATRIC: No c/o anxiety or sadness   Vitals:   07/18/18 1327  BP: (!) 180/80  Pulse: 88  Resp: 18  Temp: (!) 96.8 F (36 C)    SpO2 Readings from Last 1 Encounters:  No data found for SpO2   Body mass index is 30.9 kg/m.     Physical Exam  GENERAL APPEARANCE: Alert, conversant,  No acute distress.  SKIN: No diaphoresis rash HEAD: Normocephalic, atraumatic  EYES: Conjunctiva/lids clear. Pupils round, reactive. EOMs intact.  EARS: External exam WNL, canals clear. Hearing grossly normal.  NOSE: No deformity or discharge.  MOUTH/THROAT: Lips w/o lesions  RESPIRATORY: Breathing is even, unlabored. Lung sounds are clear   CARDIOVASCULAR: Heart RRR no murmurs, rubs or gallops. No peripheral edema.   GASTROINTESTINAL: Abdomen is soft, non-tender, not distended w/ normal bowel sounds. GENITOURINARY: Bladder non tender, not distended  MUSCULOSKELETAL: Postop splint with dressing right lower extremity NEUROLOGIC:  Cranial nerves 2-12 grossly intact. Moves all extremities  PSYCHIATRIC: Mood and affect  appropriate to situation, no behavioral issues  Patient Active Problem List   Diagnosis Date Noted  . Impacted gallstone of gallbladder 03/29/2018  . Cellulitis and abscess of toe of left foot 03/29/2018  . Atrial fibrillation with RVR (HCC) 03/29/2018  . Hypertension 03/29/2018  . Hyperlipidemia 03/29/2018  . Hypothyroidism 03/29/2018  . Chronic pain syndrome 03/29/2018      Labs reviewed: Basic Metabolic Panel:    Component Value Date/Time   NA 141 03/31/2018   K 4.2 03/31/2018   BUN 12 03/31/2018   CREATININE 0.7 03/31/2018    Recent Labs    03/31/18  NA 141  K 4.2  BUN 12  CREATININE 0.7   Liver Function Tests: No results for input(s): AST, ALT, ALKPHOS, BILITOT, PROT, ALBUMIN in the last 8760 hours. No results for input(s): LIPASE, AMYLASE in the last 8760 hours. No results for input(s): AMMONIA in the last 8760 hours. CBC: Recent Labs    03/31/18  WBC 5.7  HGB 11.7*  HCT 34*  PLT 169   Lipid No results for input(s): CHOL, HDL, LDLCALC, TRIG in the last 8760 hours.  Cardiac Enzymes: No results for input(s): CKTOTAL, CKMB, CKMBINDEX, TROPONINI in the last 8760 hours. BNP: No results for input(s): BNP in the last 8760 hours. No results found for: MICROALBUR No results found for: HGBA1C No results found for: TSH No results found for: VITAMINB12 No results found for: FOLATE No results found for: IRON, TIBC, FERRITIN  Imaging and Procedures obtained prior to SNF admission: Mr Thoracic Spine Wo Contrast  Result Date: 06/02/2016 CLINICAL DATA:  Thoracic post-laminectomy syndrome. Worsening low back pain. EXAM: MRI THORACIC SPINE WITHOUT CONTRAST TECHNIQUE: Multiplanar, multisequence MR imaging of the thoracic spine was performed. No intravenous contrast was administered. COMPARISON:  Chest CT 09/29/2014.  CT abdomen and pelvis 12/27/2014. FINDINGS: Alignment: Slight S-shaped thoracic scoliosis, convex left in the lower thoracic spine. No listhesis.  Vertebrae: No evidence of fracture, osseous lesion, or significant marrow edema. Multiple small Schmorl's nodes are present throughout the mid and lower thoracic spine. Cord:  Normal signal and morphology. Paraspinal and other soft tissues: 2 cm stone in the gallbladder common bile duct dilatation up to 12 mm, incompletely visualized and potentially mildly increased compared to the prior abdominal CT. Disc levels: Partially  visualized disc degeneration at C6-7 with broad-based posterior disc osteophyte complex resulting in likely mild spinal stenosis. There is mild disc bulging from T5-6 to T12-L1, greatest at T12-L1 where there is minimal left lateral recess narrowing without spinal stenosis, neural foraminal stenosis, or evidence of neural impingement. Spinal canal and neural foramina are widely patent elsewhere in the thoracic spine. IMPRESSION: Mild thoracic spondylosis without evidence of neural impingement. Electronically Signed   By: Sebastian Ache M.D.   On: 06/02/2016 18:52   Mr Lumbar Spine Wo Contrast  Result Date: 06/02/2016 CLINICAL DATA:  Chronic central and right-sided low back pain radiating into the right leg to the foot. Prior surgery. EXAM: MRI LUMBAR SPINE WITHOUT CONTRAST TECHNIQUE: Multiplanar, multisequence MR imaging of the lumbar spine was performed. No intravenous contrast was administered. COMPARISON:  02/09/2011 FINDINGS: Segmentation:  Standard. Alignment: Trace retrolisthesis of L2 on L3 and L3 on L4 and trace anterolisthesis of L4 on L5, degenerative in appearance. Vertebrae: No fracture or suspicious osseous lesion. Progressive disc degeneration at L2-3 with new moderate degenerative edema. Predominantly type 2 endplate changes at L4-5. Scattered small Schmorl's nodes. Conus medullaris: Extends to the L1 level and appears normal. Paraspinal and other soft tissues: No acute abnormality. Disc levels: Disc desiccation throughout the lumbar spine. Moderate disc space narrowing at L2-3  and L3-4 and severe narrowing at L4-5. T11-12 and T12-L1: Only imaged sagittally. Mild disc bulging at both levels without significant stenosis. L1-2: Circumferential disc bulging and mild facet arthrosis result in minimal left neural foraminal narrowing without spinal stenosis, unchanged. L2-3: Circumferential disc bulging, endplate spurring, mild facet and ligamentum flavum hypertrophy, and prominent dorsal epidural fat result in moderate spinal stenosis and moderate left and mild right neural foraminal stenosis, overall mildly progressed. L3-4: Circumferential disc bulging and mild facet hypertrophy result in mild bilateral lateral recess and mild bilateral neural foraminal stenosis, not significantly changed. L4-5: Prior right laminectomy again noted. Disc bulging, endplate spurring, small right paracentral disc protrusion, and moderate facet arthrosis result in mild right lateral recess and moderate right greater than left neural foraminal stenosis, not significantly changed. No spinal stenosis. L5-S1: Moderate facet arthrosis without disc herniation or stenosis, unchanged. IMPRESSION: 1. Progressive disc degeneration at L2-3 with moderate spinal stenosis and moderate left neural foraminal stenosis. 2. Unchanged disc and facet degeneration elsewhere with up to moderate neural foraminal stenosis as above. Electronically Signed   By: Sebastian Ache M.D.   On: 06/02/2016 18:46     Not all labs, radiology exams or other studies done during hospitalization come through on my EPIC note; however they are reviewed by me.    Assessment and Plan  Right bimalleolar fracture with mild displacement- patient underwent ORIF on 3/2; pain not controlled initially so PRN Norco was increased to every 4 as needed with good control SNF- patient admitted for OT/PT  Atrial for with RVR/paroxysmal atrial fib-accompanied by low blood pressure patient had been on Lopressor and losartan at home, cardiology was consulted and  added Cardizem and losartan was stopped due to low blood pressure; patient converted while on Cardizem and Lopressor SNF- continue Cardizem 60 mg every 6 and Lopressor 25 mg twice daily and Xarelto 20 mg daily as prophylaxis  UTI- patient treated with Rocephin  and then transition to Omnicef: CNS pending, will let patient know if she needs to continue the [facility]- continue Omnicef 300 mg twice daily for 4 days  Hyperlipidemia SNF- not stated as uncontrolled; continue Lipitor 10 mg daily  Hypothyroidism  SNF-not stated as uncontrolled; continue levothyroxine 150 mcg daily  Depression SNF- appears controlled; continue Pristiq 100 mg nightly    Time spent greater than 45 minutes;> 50% of time with patient was spent reviewing records, labs, tests and studies, counseling and developing plan of care  Thurston Hole D. Lyn Hollingshead, MD

## 2018-07-20 ENCOUNTER — Encounter: Payer: Self-pay | Admitting: Internal Medicine

## 2018-07-20 DIAGNOSIS — F32A Depression, unspecified: Secondary | ICD-10-CM | POA: Insufficient documentation

## 2018-07-20 DIAGNOSIS — I480 Paroxysmal atrial fibrillation: Secondary | ICD-10-CM | POA: Insufficient documentation

## 2018-07-20 DIAGNOSIS — N390 Urinary tract infection, site not specified: Secondary | ICD-10-CM | POA: Insufficient documentation

## 2018-07-20 DIAGNOSIS — F329 Major depressive disorder, single episode, unspecified: Secondary | ICD-10-CM | POA: Insufficient documentation

## 2018-07-20 DIAGNOSIS — S82841A Displaced bimalleolar fracture of right lower leg, initial encounter for closed fracture: Secondary | ICD-10-CM | POA: Insufficient documentation

## 2018-07-21 ENCOUNTER — Encounter: Payer: Self-pay | Admitting: Internal Medicine

## 2018-07-21 ENCOUNTER — Other Ambulatory Visit: Payer: Self-pay | Admitting: Internal Medicine

## 2018-07-21 ENCOUNTER — Non-Acute Institutional Stay (SKILLED_NURSING_FACILITY): Payer: Medicare HMO | Admitting: Internal Medicine

## 2018-07-21 DIAGNOSIS — M25571 Pain in right ankle and joints of right foot: Secondary | ICD-10-CM

## 2018-07-21 DIAGNOSIS — R52 Pain, unspecified: Secondary | ICD-10-CM

## 2018-07-21 MED ORDER — OXYCODONE HCL 10 MG PO TABS
10.0000 mg | ORAL_TABLET | ORAL | 0 refills | Status: DC | PRN
Start: 2018-07-21 — End: 2018-07-29

## 2018-07-21 NOTE — Progress Notes (Signed)
Location:      Place of Service:     Margit Hanks, MD  Patient Care Team: Margit Hanks, MD as PCP - General (Internal Medicine)  Extended Emergency Contact Information Primary Emergency Contact: Colin,Glenn Address: [address]          [place], [place] [address] [place] of [place] Home Phone: [phone] Mobile Phone: [phone] Relation: Spouse Secondary Emergency Contact: Kruge,Heather Mobile Phone: [phone] Relation: Daughter    Allergies: Patient has no known allergies.  No chief complaint on file.   HPI: Patient is 68 y.o. female who   Past Medical History:  Diagnosis Date  . Abscess of left foot   . Atrial fibrillation with RVR (HCC)   . Carotid artery stenosis 2018   Left 40-59%, Right 1-39%  . CHF (congestive heart failure) (HCC)   . Chronic pain syndrome   . Chronic prescription opiate use   . Degeneration of cervical intervertebral disc   . Degeneration of lumbar or lumbosacral intervertebral disc   . Depressive disorder   . DVT (deep venous thrombosis) (HCC)   . Hyperlipidemia   . Hypertension   . OSA (obstructive sleep apnea)    No CPAP  . Paroxysmal atrial fibrillation (HCC)   . Pneumonia   . Postlaminectomy syndrome, lumbar region   . Sepsis (HCC)   . Status post laparoscopic cholecystectomy   . Thyroid disease   . Transfusion history     Past Surgical History:  Procedure Laterality Date  . BACK SURGERY    . BREAST LUMPECTOMY    . CARDIAC CATHETERIZATION    . CARDIAC CATHETERIZATION    . CARDIAC PACEMAKER PLACEMENT    . COLONOSCOPY WITH ESOPHAGOGASTRODUODENOSCOPY (EGD)    . COLONOSCOPY, ESOPHAGOGASTRODUODENOSCOPY (EGD) AND ESOPHAGEAL DILATION    . EVACUATION BREAST HEMATOMA    . LUMBAR MICRODISCECTOMY    . TONSILLECTOMY      Allergies as of 07/21/2018   No Known Allergies     Medication List       Accurate as of July 21, 2018  3:35 PM. Always use your most recent med list.        acetaminophen  500 MG tablet Commonly known as:  TYLENOL Take 1,000 mg by mouth every 6 (six) hours as needed.   aspirin 81 MG chewable tablet Chew 1 tablet (81 mg total) by mouth daily.   atorvastatin 20 MG tablet Commonly known as:  LIPITOR Take 1 tablet (20 mg total) by mouth daily. 1 Tablet (20 mg total) by mouth daily at 6p   cefdinir 300 MG capsule Commonly known as:  OMNICEF Take 300 mg by mouth 2 (two) times daily.   desvenlafaxine 50 MG 24 hr tablet Commonly known as:  PRISTIQ Take 2 tablets (100 mg total) by mouth at bedtime.   diltiazem 60 MG tablet Commonly known as:  CARDIZEM Take 60 mg by mouth every 6 (six) hours.   docusate sodium 100 MG capsule Commonly known as:  COLACE Take 100 mg by mouth 2 (two) times daily.   gabapentin 800 MG tablet Commonly known as:  NEURONTIN Take 800 mg by mouth 3 (three) times daily.   HYDROcodone-acetaminophen 10-325 MG tablet Commonly known as:  NORCO Take 1 tablet by mouth every 4 (four) hours as needed (for 2 weeks only).   levothyroxine 150 MCG tablet Commonly known as:  SYNTHROID, LEVOTHROID Take 1 tablet (150 mcg total) by mouth daily before breakfast.   metoprolol tartrate 25 MG tablet  Commonly known as:  LOPRESSOR Take 1 tablet (25 mg total) by mouth 2 (two) times daily.   morphine 15 MG 12 hr tablet Commonly known as:  MS CONTIN Take 1 tablet (15 mg total) by mouth every 8 (eight) hours.   rivaroxaban 20 MG Tabs tablet Commonly known as:  XARELTO Take 1 tablet (20 mg total) by mouth daily with supper.   tiZANidine 4 MG tablet Commonly known as:  ZANAFLEX Take 4 mg by mouth at bedtime.       No orders of the defined types were placed in this encounter.    There is no immunization history on file for this patient.  Social History   Tobacco Use  . Smoking status: Former Smoker    Packs/day: 1.00    Years: 40.00    Pack years: 40.00  . Smokeless tobacco: Former Engineer, water Use Topics  . Alcohol use: Not  on file    Review of Systems  DATA OBTAINED: from patient, nurse, medical record, family member GENERAL:  no fevers, fatigue, appetite changes SKIN: No itching, rash HEENT: No complaint RESPIRATORY: No cough, wheezing, SOB CARDIAC: No chest pain, palpitations, lower extremity edema  GI: No abdominal pain, No N/V/D or constipation, No heartburn or reflux  GU: No dysuria, frequency or urgency, or incontinence  MUSCULOSKELETAL: No unrelieved bone/joint pain NEUROLOGIC: No headache, dizziness  PSYCHIATRIC: No overt anxiety or sadness  There were no vitals filed for this visit. There is no height or weight on file to calculate BMI. Physical Exam  GENERAL APPEARANCE: Alert, conversant, No acute distress  SKIN: No diaphoresis rash HEENT: Unremarkable RESPIRATORY: Breathing is even, unlabored. Lung sounds are clear   CARDIOVASCULAR: Heart RRR no murmurs, rubs or gallops. No peripheral edema  GASTROINTESTINAL: Abdomen is soft, non-tender, not distended w/ normal bowel sounds.  GENITOURINARY: Bladder non tender, not distended  MUSCULOSKELETAL: No abnormal joints or musculature NEUROLOGIC: Cranial nerves 2-12 grossly intact. Moves all extremities PSYCHIATRIC: Mood and affect appropriate to situation, no behavioral issues  Patient Active Problem List   Diagnosis Date Noted  . Bimalleolar fracture of right ankle 07/20/2018  . Paroxysmal atrial fibrillation (HCC) 07/20/2018  . UTI (urinary tract infection) 07/20/2018  . Depression 07/20/2018  . Impacted gallstone of gallbladder 03/29/2018  . Cellulitis and abscess of toe of left foot 03/29/2018  . Atrial fibrillation with RVR (HCC) 03/29/2018  . Hypertension 03/29/2018  . Hyperlipidemia 03/29/2018  . Hypothyroidism 03/29/2018  . Chronic pain syndrome 03/29/2018    CMP     Component Value Date/Time   NA 144 07/18/2018   K 4.3 07/18/2018   BUN 10 07/18/2018   CREATININE 0.5 07/18/2018   Recent Labs    03/31/18 07/18/18  NA  141 144  K 4.2 4.3  BUN 12 10  CREATININE 0.7 0.5   No results for input(s): AST, ALT, ALKPHOS, BILITOT, PROT, ALBUMIN in the last 8760 hours. Recent Labs    03/31/18 07/18/18  WBC 5.7 6.0  HGB 11.7* 12.3  HCT 34* 36  PLT 169 189   No results for input(s): CHOL, LDLCALC, TRIG in the last 8760 hours.  Invalid input(s): HCL No results found for: MICROALBUR No results found for: TSH No results found for: HGBA1C No results found for: CHOL, HDL, LDLCALC, LDLDIRECT, TRIG, CHOLHDL  Significant Diagnostic Results in last 30 days:  No results found.  Assessment and Plan  No problem-specific Assessment & Plan notes found for this encounter.   Labs/tests ordered:  Merrilee Seashore, MD   This encounter was created in error - please disregard.

## 2018-07-21 NOTE — Progress Notes (Signed)
:  [facility] Room Number: [phone] Place of Service:  SNF (31)  Blakleigh Straw D. Lyn Hollingshead, MD  Patient Care Team: Margit Hanks, MD as PCP - General (Internal Medicine)  Extended Emergency Contact Information Primary Emergency Contact: Titus,Glenn Address: [address]          [place], [place] [address] Darden Amber of [place] Home Phone: [phone] Mobile Phone: [phone] Relation: Spouse Secondary Emergency Contact: Kruge,Heather Mobile Phone: [phone] Relation: Daughter     Allergies: Patient has no known allergies.  Chief Complaint  Patient presents with  . Acute Visit    HPI: Patient is 68 y.o. female who being seen to discuss her pain medications.  Patient is recovering from a bimalleolar fracture.  She is on MS Contin 15 mg every 12 hours and on Norco 10 mg every 4 which she said is not touching her pain.  The reason is not touching her pain is that she goes to a pain clinic and she uses Norco on a regular basis for her back pain so she is not really getting anything for her ankle fracture.  Past Medical History:  Diagnosis Date  . Abscess of left foot   . Atrial fibrillation with RVR (HCC)   . Carotid artery stenosis 2018   Left 40-59%, Right 1-39%  . CHF (congestive heart failure) (HCC)   . Chronic pain syndrome   . Chronic prescription opiate use   . Degeneration of cervical intervertebral disc   . Degeneration of lumbar or lumbosacral intervertebral disc   . Depressive disorder   . DVT (deep venous thrombosis) (HCC)   . Hyperlipidemia   . Hypertension   . OSA (obstructive sleep apnea)    No CPAP  . Paroxysmal atrial fibrillation (HCC)   . Pneumonia   . Postlaminectomy syndrome, lumbar region   . Sepsis (HCC)   . Status post laparoscopic cholecystectomy   . Thyroid disease   . Transfusion history     Past Surgical History:  Procedure Laterality Date  . BACK SURGERY    . BREAST LUMPECTOMY    .  CARDIAC CATHETERIZATION    . CARDIAC CATHETERIZATION    . CARDIAC PACEMAKER PLACEMENT    . COLONOSCOPY WITH ESOPHAGOGASTRODUODENOSCOPY (EGD)    . COLONOSCOPY, ESOPHAGOGASTRODUODENOSCOPY (EGD) AND ESOPHAGEAL DILATION    . EVACUATION BREAST HEMATOMA    . LUMBAR MICRODISCECTOMY    . TONSILLECTOMY      Allergies as of 07/21/2018   No Known Allergies     Medication List       Accurate as of July 21, 2018  3:44 PM. Always use your most recent med list.        acetaminophen 500 MG tablet Commonly known as:  TYLENOL Take 1,000 mg by mouth every 6 (six) hours as needed.   aspirin 81 MG chewable tablet Chew 1 tablet (81 mg total) by mouth daily.   atorvastatin 20 MG tablet Commonly known as:  LIPITOR Take 1 tablet (20 mg total) by mouth daily. 1 Tablet (20 mg total) by mouth daily at 6p   desvenlafaxine 50 MG 24 hr tablet Commonly known as:  PRISTIQ Take 2 tablets (100 mg total) by mouth at bedtime.   diltiazem 60 MG tablet Commonly known as:  CARDIZEM Take 60 mg by mouth every 6 (six) hours.   docusate sodium 100 MG capsule Commonly known as:  COLACE Take 100 mg by mouth 2 (two) times daily.  gabapentin 800 MG tablet Commonly known as:  NEURONTIN Take 800 mg by mouth 3 (three) times daily.   HYDROcodone-acetaminophen 10-325 MG tablet Commonly known as:  NORCO Take 1 tablet by mouth. Give 1 tablet by mouth every 4 hours as needed for up to 14 days for pain   levothyroxine 150 MCG tablet Commonly known as:  SYNTHROID, LEVOTHROID Take 1 tablet (150 mcg total) by mouth daily before breakfast.   metoprolol tartrate 25 MG tablet Commonly known as:  LOPRESSOR Take 1 tablet (25 mg total) by mouth 2 (two) times daily.   morphine 15 MG 12 hr tablet Commonly known as:  MS CONTIN Take 1 tablet (15 mg total) by mouth every 8 (eight) hours.   rivaroxaban 20 MG Tabs tablet Commonly known as:  XARELTO Take 1 tablet (20 mg total) by mouth daily with supper.   tiZANidine 4 MG  tablet Commonly known as:  ZANAFLEX Take 4 mg by mouth at bedtime.       No orders of the defined types were placed in this encounter.    There is no immunization history on file for this patient.  Social History   Tobacco Use  . Smoking status: Former Smoker    Packs/day: 1.00    Years: 40.00    Pack years: 40.00  . Smokeless tobacco: Former Engineer, water Use Topics  . Alcohol use: Not on file    Family history is   Family History  Problem Relation Age of Onset  . Breast cancer Mother 57  . Cancer Mother   . Hypertension Father   . Cancer Sister       Review of Systems  DATA OBTAINED: from patient GENERAL:  no fevers, fatigue, appetite changes SKIN: No itching, or rash EYES: No eye pain, redness, discharge EARS: No earache, tinnitus, change in hearing NOSE: No congestion, drainage or bleeding  MOUTH/THROAT: No mouth or tooth pain, No sore throat RESPIRATORY: No cough, wheezing, SOB CARDIAC: No chest pain, palpitations, lower extremity edema  GI: No abdominal pain, No N/V/D or constipation, No heartburn or reflux  GU: No dysuria, frequency or urgency, or incontinence  MUSCULOSKELETAL: +unrelieved ankle pain NEUROLOGIC: No headache, dizziness or focal weakness PSYCHIATRIC: No c/o anxiety or sadness   Vitals:   07/21/18 1538  BP: 135/68  Pulse: 74  Resp: 18  Temp: 98.1 F (36.7 C)    SpO2 Readings from Last 1 Encounters:  No data found for SpO2   Body mass index is 30.9 kg/m.     Physical Exam  GENERAL APPEARANCE: Alert, conversant,  No acute distress.  SKIN: No diaphoresis rash HEAD: Normocephalic, atraumatic  EYES: Conjunctiva/lids clear. Pupils round, reactive. EOMs intact.  EARS: External exam WNL, canals clear. Hearing grossly normal.  NOSE: No deformity or discharge.  MOUTH/THROAT: Lips w/o lesions  RESPIRATORY: Breathing is even, unlabored. Lung sounds are clear   CARDIOVASCULAR: Heart RRR no murmurs, rubs or gallops. No  peripheral edema.   GASTROINTESTINAL: Abdomen is soft, non-tender, not distended w/ normal bowel sounds. GENITOURINARY: Bladder non tender, not distended  MUSCULOSKELETAL postop splint and dressing NEUROLOGIC:  Cranial nerves 2-12 grossly intact. Moves all extremities  PSYCHIATRIC: Mood and affect appropriate to situation, no behavioral issues  Patient Active Problem List   Diagnosis Date Noted  . Bimalleolar fracture of right ankle 07/20/2018  . Paroxysmal atrial fibrillation (HCC) 07/20/2018  . UTI (urinary tract infection) 07/20/2018  . Depression 07/20/2018  . Impacted gallstone of gallbladder 03/29/2018  . Cellulitis  and abscess of toe of left foot 03/29/2018  . Atrial fibrillation with RVR (HCC) 03/29/2018  . Hypertension 03/29/2018  . Hyperlipidemia 03/29/2018  . Hypothyroidism 03/29/2018  . Chronic pain syndrome 03/29/2018      Labs reviewed: Basic Metabolic Panel:    Component Value Date/Time   NA 144 07/18/2018   K 4.3 07/18/2018   BUN 10 07/18/2018   CREATININE 0.5 07/18/2018    Recent Labs    03/31/18 07/18/18  NA 141 144  K 4.2 4.3  BUN 12 10  CREATININE 0.7 0.5   Liver Function Tests: No results for input(s): AST, ALT, ALKPHOS, BILITOT, PROT, ALBUMIN in the last 8760 hours. No results for input(s): LIPASE, AMYLASE in the last 8760 hours. No results for input(s): AMMONIA in the last 8760 hours. CBC: Recent Labs    03/31/18 07/18/18  WBC 5.7 6.0  HGB 11.7* 12.3  HCT 34* 36  PLT 169 189   Lipid No results for input(s): CHOL, HDL, LDLCALC, TRIG in the last 8760 hours.  Cardiac Enzymes: No results for input(s): CKTOTAL, CKMB, CKMBINDEX, TROPONINI in the last 8760 hours. BNP: No results for input(s): BNP in the last 8760 hours. No results found for: MICROALBUR No results found for: HGBA1C No results found for: TSH No results found for: VITAMINB12 No results found for: FOLATE No results found for: IRON, TIBC, FERRITIN  Imaging and Procedures  obtained prior to SNF admission: Mr Thoracic Spine Wo Contrast  Result Date: 06/02/2016 CLINICAL DATA:  Thoracic post-laminectomy syndrome. Worsening low back pain. EXAM: MRI THORACIC SPINE WITHOUT CONTRAST TECHNIQUE: Multiplanar, multisequence MR imaging of the thoracic spine was performed. No intravenous contrast was administered. COMPARISON:  Chest CT 09/29/2014.  CT abdomen and pelvis 12/27/2014. FINDINGS: Alignment: Slight S-shaped thoracic scoliosis, convex left in the lower thoracic spine. No listhesis. Vertebrae: No evidence of fracture, osseous lesion, or significant marrow edema. Multiple small Schmorl's nodes are present throughout the mid and lower thoracic spine. Cord:  Normal signal and morphology. Paraspinal and other soft tissues: 2 cm stone in the gallbladder common bile duct dilatation up to 12 mm, incompletely visualized and potentially mildly increased compared to the prior abdominal CT. Disc levels: Partially visualized disc degeneration at C6-7 with broad-based posterior disc osteophyte complex resulting in likely mild spinal stenosis. There is mild disc bulging from T5-6 to T12-L1, greatest at T12-L1 where there is minimal left lateral recess narrowing without spinal stenosis, neural foraminal stenosis, or evidence of neural impingement. Spinal canal and neural foramina are widely patent elsewhere in the thoracic spine. IMPRESSION: Mild thoracic spondylosis without evidence of neural impingement. Electronically Signed   By: Sebastian Ache M.D.   On: 06/02/2016 18:52   Mr Lumbar Spine Wo Contrast  Result Date: 06/02/2016 CLINICAL DATA:  Chronic central and right-sided low back pain radiating into the right leg to the foot. Prior surgery. EXAM: MRI LUMBAR SPINE WITHOUT CONTRAST TECHNIQUE: Multiplanar, multisequence MR imaging of the lumbar spine was performed. No intravenous contrast was administered. COMPARISON:  02/09/2011 FINDINGS: Segmentation:  Standard. Alignment: Trace retrolisthesis  of L2 on L3 and L3 on L4 and trace anterolisthesis of L4 on L5, degenerative in appearance. Vertebrae: No fracture or suspicious osseous lesion. Progressive disc degeneration at L2-3 with new moderate degenerative edema. Predominantly type 2 endplate changes at L4-5. Scattered small Schmorl's nodes. Conus medullaris: Extends to the L1 level and appears normal. Paraspinal and other soft tissues: No acute abnormality. Disc levels: Disc desiccation throughout the lumbar spine. Moderate disc space  narrowing at L2-3 and L3-4 and severe narrowing at L4-5. T11-12 and T12-L1: Only imaged sagittally. Mild disc bulging at both levels without significant stenosis. L1-2: Circumferential disc bulging and mild facet arthrosis result in minimal left neural foraminal narrowing without spinal stenosis, unchanged. L2-3: Circumferential disc bulging, endplate spurring, mild facet and ligamentum flavum hypertrophy, and prominent dorsal epidural fat result in moderate spinal stenosis and moderate left and mild right neural foraminal stenosis, overall mildly progressed. L3-4: Circumferential disc bulging and mild facet hypertrophy result in mild bilateral lateral recess and mild bilateral neural foraminal stenosis, not significantly changed. L4-5: Prior right laminectomy again noted. Disc bulging, endplate spurring, small right paracentral disc protrusion, and moderate facet arthrosis result in mild right lateral recess and moderate right greater than left neural foraminal stenosis, not significantly changed. No spinal stenosis. L5-S1: Moderate facet arthrosis without disc herniation or stenosis, unchanged. IMPRESSION: 1. Progressive disc degeneration at L2-3 with moderate spinal stenosis and moderate left neural foraminal stenosis. 2. Unchanged disc and facet degeneration elsewhere with up to moderate neural foraminal stenosis as above. Electronically Signed   By: Sebastian Ache M.D.   On: 06/02/2016 18:46     Not all labs, radiology  exams or other studies done during hospitalization come through on my EPIC note; however they are reviewed by me.    Assessment and Plan:  Right ankle pain/pain control issues- patient is correct, she would need higher level of pain control than she is used to to cover a new pain, this is a way pain receptors work; will put her on oxycodone 10 mg every 4 as needed for 2 weeks; I asked her if she would like for me to schedule it and she has had no because she did not think she would use as much as that may: Being at the pain clinic already patient is well aware of the addictive qualities of narcotics    Thurston Hole D. Lyn Hollingshead, MD

## 2018-07-23 ENCOUNTER — Encounter: Payer: Self-pay | Admitting: Internal Medicine

## 2018-07-23 LAB — BASIC METABOLIC PANEL
BUN: 16 (ref 4–21)
Creatinine: 0.7 (ref 0.5–1.1)
GLUCOSE: 157
POTASSIUM: 4 (ref 3.4–5.3)
SODIUM: 141 (ref 137–147)

## 2018-07-23 LAB — CBC AND DIFFERENTIAL
HEMATOCRIT: 35 — AB (ref 36–46)
HEMOGLOBIN: 11.8 — AB (ref 12.0–16.0)
PLATELETS: 236 (ref 150–399)
WBC: 7.5

## 2018-07-28 ENCOUNTER — Encounter: Payer: Self-pay | Admitting: Internal Medicine

## 2018-07-29 ENCOUNTER — Encounter: Payer: Self-pay | Admitting: Internal Medicine

## 2018-07-29 ENCOUNTER — Other Ambulatory Visit: Payer: Self-pay | Admitting: Internal Medicine

## 2018-07-29 ENCOUNTER — Non-Acute Institutional Stay (SKILLED_NURSING_FACILITY): Payer: Medicare HMO | Admitting: Internal Medicine

## 2018-07-29 DIAGNOSIS — I480 Paroxysmal atrial fibrillation: Secondary | ICD-10-CM | POA: Diagnosis not present

## 2018-07-29 DIAGNOSIS — E034 Atrophy of thyroid (acquired): Secondary | ICD-10-CM

## 2018-07-29 DIAGNOSIS — N3000 Acute cystitis without hematuria: Secondary | ICD-10-CM

## 2018-07-29 DIAGNOSIS — F329 Major depressive disorder, single episode, unspecified: Secondary | ICD-10-CM

## 2018-07-29 DIAGNOSIS — F32A Depression, unspecified: Secondary | ICD-10-CM

## 2018-07-29 DIAGNOSIS — S82841D Displaced bimalleolar fracture of right lower leg, subsequent encounter for closed fracture with routine healing: Secondary | ICD-10-CM | POA: Diagnosis not present

## 2018-07-29 DIAGNOSIS — I10 Essential (primary) hypertension: Secondary | ICD-10-CM

## 2018-07-29 DIAGNOSIS — I4891 Unspecified atrial fibrillation: Secondary | ICD-10-CM | POA: Diagnosis not present

## 2018-07-29 DIAGNOSIS — E785 Hyperlipidemia, unspecified: Secondary | ICD-10-CM

## 2018-07-29 MED ORDER — OXYCODONE HCL 10 MG PO TABS: 10.0000 mg | ORAL_TABLET | Freq: Four times a day (QID) | ORAL | 0 refills | Status: AC | PRN

## 2018-07-29 MED ORDER — TIZANIDINE HCL 4 MG PO TABS: 4.0000 mg | ORAL_TABLET | Freq: Every day | ORAL | 0 refills | Status: AC

## 2018-07-29 MED ORDER — DILTIAZEM HCL 60 MG PO TABS: 60.0000 mg | ORAL_TABLET | Freq: Four times a day (QID) | ORAL | 0 refills | Status: AC

## 2018-07-29 MED ORDER — RIVAROXABAN 20 MG PO TABS: 20.0000 mg | ORAL_TABLET | Freq: Every day | ORAL | 0 refills | Status: AC

## 2018-07-29 MED ORDER — GABAPENTIN 800 MG PO TABS: 800.0000 mg | ORAL_TABLET | Freq: Three times a day (TID) | ORAL | 0 refills | Status: AC

## 2018-07-29 MED ORDER — ATORVASTATIN CALCIUM 20 MG PO TABS: 20.0000 mg | ORAL_TABLET | Freq: Every day | ORAL | 0 refills | Status: AC

## 2018-07-29 MED ORDER — DESVENLAFAXINE SUCCINATE ER 50 MG PO TB24: 100.0000 mg | ORAL_TABLET | Freq: Every day | ORAL | 0 refills | Status: AC

## 2018-07-29 MED ORDER — LEVOTHYROXINE SODIUM 150 MCG PO TABS: 150.0000 ug | ORAL_TABLET | Freq: Every day | ORAL | 0 refills | Status: AC

## 2018-07-29 MED ORDER — METOPROLOL TARTRATE 25 MG PO TABS: 25.0000 mg | ORAL_TABLET | Freq: Two times a day (BID) | ORAL | 0 refills | Status: AC

## 2018-07-29 MED ORDER — MORPHINE SULFATE ER 15 MG PO TBCR: 15.0000 mg | EXTENDED_RELEASE_TABLET | Freq: Three times a day (TID) | ORAL | 0 refills | Status: AC

## 2018-07-29 NOTE — Progress Notes (Signed)
:   Location[facility] Room Number: [phone] Place of Service:  SNF (31)  Randon Goldsmith. Lyn Hollingshead, MD  Patient Care Team: Margit Hanks, MD as PCP - General (Internal Medicine)  Extended Emergency Contact Information Primary Emergency Contact: Alioto,Glenn Address: [address]          [place], [place] [address] D[place] of [place] Home Phone: [phone] Mobile Phone: [phone] Relation: Spouse Secondary Emergency Contact: Kruge,Heather Mobile Phone: [phone] Relation: Daughter     Allergies: Patient has no known allergies.  Chief Complaint  Patient presents with   Acute Visit    HPI: Patient is 68 y.o. female who  Past Medical History:  Diagnosis Date   Abscess of left foot    Atrial fibrillation with RVR (HCC)    Carotid artery stenosis 2018   Left 40-59%, Right 1-39%   CHF (congestive heart failure) (HCC)    Chronic pain syndrome    Chronic prescription opiate use    Degeneration of cervical intervertebral disc    Degeneration of lumbar or lumbosacral intervertebral disc    Depressive disorder    DVT (deep venous thrombosis) (HCC)    Hyperlipidemia    Hypertension    OSA (obstructive sleep apnea)    No CPAP   Paroxysmal atrial fibrillation (HCC)    Pneumonia    Postlaminectomy syndrome, lumbar region    Sepsis (HCC)    Status post laparoscopic cholecystectomy    Thyroid disease    Transfusion history     Past Surgical History:  Procedure Laterality Date   BACK SURGERY     BREAST LUMPECTOMY     CARDIAC CATHETERIZATION     CARDIAC CATHETERIZATION     CARDIAC PACEMAKER PLACEMENT     COLONOSCOPY WITH ESOPHAGOGASTRODUODENOSCOPY (EGD)     COLONOSCOPY, ESOPHAGOGASTRODUODENOSCOPY (EGD) AND ESOPHAGEAL DILATION     EVACUATION BREAST HEMATOMA     LUMBAR MICRODISCECTOMY     TONSILLECTOMY      Allergies as of 07/28/2018   No Known Allergies     Medication List       Accurate  as of July 28, 2018 11:59 PM. Always use your most recent med list.        acetaminophen 500 MG tablet Commonly known as:  TYLENOL Take 1,000 mg by mouth every 6 (six) hours as needed.   aspirin 81 MG chewable tablet Chew 1 tablet (81 mg total) by mouth daily.   atorvastatin 20 MG tablet Commonly known as:  LIPITOR Take 1 tablet (20 mg total) by mouth daily. 1 Tablet (20 mg total) by mouth daily at 6p   desvenlafaxine 50 MG 24 hr tablet Commonly known as:  PRISTIQ Take 2 tablets (100 mg total) by mouth at bedtime.   diltiazem 60 MG tablet Commonly known as:  CARDIZEM Take 60 mg by mouth every 6 (six) hours.   docusate sodium 100 MG capsule Commonly known as:  COLACE Take 100 mg by mouth 2 (two) times daily.   gabapentin 800 MG tablet Commonly known as:  NEURONTIN Take 800 mg by mouth 3 (three) times daily.   levothyroxine 150 MCG tablet Commonly known as:  SYNTHROID, LEVOTHROID Take 1 tablet (150 mcg total) by mouth daily before breakfast.   metoprolol tartrate 25 MG tablet Commonly known as:  LOPRESSOR Take 1 tablet (25 mg total) by mouth 2 (two) times daily.   morphine 15 MG 12 hr tablet Commonly known as:  MS CONTIN Take  1 tablet (15 mg total) by mouth every 8 (eight) hours.   Oxycodone HCl 10 MG Tabs Take 1 tablet (10 mg total) by mouth every 4 (four) hours as needed (for only 2 weeks).   rivaroxaban 20 MG Tabs tablet Commonly known as:  XARELTO Take 1 tablet (20 mg total) by mouth daily with supper.   tiZANidine 4 MG tablet Commonly known as:  ZANAFLEX Take 4 mg by mouth at bedtime.       No orders of the defined types were placed in this encounter.    There is no immunization history on file for this patient.  Social History   Tobacco Use   Smoking status: Former Smoker    Packs/day: 1.00    Years: 40.00    Pack years: 40.00   Smokeless tobacco: Former Engineer, water Use Topics   Alcohol use: Not on file    Family history is    Family History  Problem Relation Age of Onset   Breast cancer Mother 59   Cancer Mother    Hypertension Father    Cancer Sister       Review of Systems  DATA OBTAINED: from patient, nurse, medical record, family member GENERAL:  no fevers, fatigue, appetite changes SKIN: No itching, or rash EYES: No eye pain, redness, discharge EARS: No earache, tinnitus, change in hearing NOSE: No congestion, drainage or bleeding  MOUTH/THROAT: No mouth or tooth pain, No sore throat RESPIRATORY: No cough, wheezing, SOB CARDIAC: No chest pain, palpitations, lower extremity edema  GI: No abdominal pain, No N/V/D or constipation, No heartburn or reflux  GU: No dysuria, frequency or urgency, or incontinence  MUSCULOSKELETAL: No unrelieved bone/joint pain NEUROLOGIC: No headache, dizziness or focal weakness PSYCHIATRIC: No c/o anxiety or sadness   Vitals:   07/29/18 1349  BP: 128/74  Pulse: 84  Resp: 18  Temp: 98.6 F (37 C)    SpO2 Readings from Last 1 Encounters:  No data found for SpO2   Body mass index is 30.9 kg/m.     Physical Exam  GENERAL APPEARANCE: Alert, conversant,  No acute distress.  SKIN: No diaphoresis rash HEAD: Normocephalic, atraumatic  EYES: Conjunctiva/lids clear. Pupils round, reactive. EOMs intact.  EARS: External exam WNL, canals clear. Hearing grossly normal.  NOSE: No deformity or discharge.  MOUTH/THROAT: Lips w/o lesions  RESPIRATORY: Breathing is even, unlabored. Lung sounds are clear   CARDIOVASCULAR: Heart RRR no murmurs, rubs or gallops. No peripheral edema.   GASTROINTESTINAL: Abdomen is soft, non-tender, not distended w/ normal bowel sounds. GENITOURINARY: Bladder non tender, not distended  MUSCULOSKELETAL: No abnormal joints or musculature NEUROLOGIC:  Cranial nerves 2-12 grossly intact. Moves all extremities  PSYCHIATRIC: Mood and affect appropriate to situation, no behavioral issues  Patient Active Problem List   Diagnosis Date  Noted   Bimalleolar fracture of right ankle 07/20/2018   Paroxysmal atrial fibrillation (HCC) 07/20/2018   UTI (urinary tract infection) 07/20/2018   Depression 07/20/2018   Impacted gallstone of gallbladder 03/29/2018   Cellulitis and abscess of toe of left foot 03/29/2018   Atrial fibrillation with RVR (HCC) 03/29/2018   Hypertension 03/29/2018   Hyperlipidemia 03/29/2018   Hypothyroidism 03/29/2018   Chronic pain syndrome 03/29/2018      Labs reviewed: Basic Metabolic Panel:    Component Value Date/Time   NA 141 07/23/2018   K 4.0 07/23/2018   BUN 16 07/23/2018   CREATININE 0.7 07/23/2018    Recent Labs    03/31/18 07/18/18 07/23/18  NA 141 144 141  K 4.2 4.3 4.0  BUN 12 10 16   CREATININE 0.7 0.5 0.7   Liver Function Tests: No results for input(s): AST, ALT, ALKPHOS, BILITOT, PROT, ALBUMIN in the last 8760 hours. No results for input(s): LIPASE, AMYLASE in the last 8760 hours. No results for input(s): AMMONIA in the last 8760 hours. CBC: Recent Labs    03/31/18 07/18/18 07/23/18  WBC 5.7 6.0 7.5  HGB 11.7* 12.3 11.8*  HCT 34* 36 35*  PLT 169 189 236   Lipid No results for input(s): CHOL, HDL, LDLCALC, TRIG in the last 8760 hours.  Cardiac Enzymes: No results for input(s): CKTOTAL, CKMB, CKMBINDEX, TROPONINI in the last 8760 hours. BNP: No results for input(s): BNP in the last 8760 hours. No results found for: MICROALBUR No results found for: HGBA1C No results found for: TSH No results found for: VITAMINB12 No results found for: FOLATE No results found for: IRON, TIBC, FERRITIN  Imaging and Procedures obtained prior to SNF admission: Mr Thoracic Spine Wo Contrast  Result Date: 06/02/2016 CLINICAL DATA:  Thoracic post-laminectomy syndrome. Worsening low back pain. EXAM: MRI THORACIC SPINE WITHOUT CONTRAST TECHNIQUE: Multiplanar, multisequence MR imaging of the thoracic spine was performed. No intravenous contrast was administered. COMPARISON:   Chest CT 09/29/2014.  CT abdomen and pelvis 12/27/2014. FINDINGS: Alignment: Slight S-shaped thoracic scoliosis, convex left in the lower thoracic spine. No listhesis. Vertebrae: No evidence of fracture, osseous lesion, or significant marrow edema. Multiple small Schmorl's nodes are present throughout the mid and lower thoracic spine. Cord:  Normal signal and morphology. Paraspinal and other soft tissues: 2 cm stone in the gallbladder common bile duct dilatation up to 12 mm, incompletely visualized and potentially mildly increased compared to the prior abdominal CT. Disc levels: Partially visualized disc degeneration at C6-7 with broad-based posterior disc osteophyte complex resulting in likely mild spinal stenosis. There is mild disc bulging from T5-6 to T12-L1, greatest at T12-L1 where there is minimal left lateral recess narrowing without spinal stenosis, neural foraminal stenosis, or evidence of neural impingement. Spinal canal and neural foramina are widely patent elsewhere in the thoracic spine. IMPRESSION: Mild thoracic spondylosis without evidence of neural impingement. Electronically Signed   By: Sebastian Ache M.D.   On: 06/02/2016 18:52   Mr Lumbar Spine Wo Contrast  Result Date: 06/02/2016 CLINICAL DATA:  Chronic central and right-sided low back pain radiating into the right leg to the foot. Prior surgery. EXAM: MRI LUMBAR SPINE WITHOUT CONTRAST TECHNIQUE: Multiplanar, multisequence MR imaging of the lumbar spine was performed. No intravenous contrast was administered. COMPARISON:  02/09/2011 FINDINGS: Segmentation:  Standard. Alignment: Trace retrolisthesis of L2 on L3 and L3 on L4 and trace anterolisthesis of L4 on L5, degenerative in appearance. Vertebrae: No fracture or suspicious osseous lesion. Progressive disc degeneration at L2-3 with new moderate degenerative edema. Predominantly type 2 endplate changes at L4-5. Scattered small Schmorl's nodes. Conus medullaris: Extends to the L1 level and  appears normal. Paraspinal and other soft tissues: No acute abnormality. Disc levels: Disc desiccation throughout the lumbar spine. Moderate disc space narrowing at L2-3 and L3-4 and severe narrowing at L4-5. T11-12 and T12-L1: Only imaged sagittally. Mild disc bulging at both levels without significant stenosis. L1-2: Circumferential disc bulging and mild facet arthrosis result in minimal left neural foraminal narrowing without spinal stenosis, unchanged. L2-3: Circumferential disc bulging, endplate spurring, mild facet and ligamentum flavum hypertrophy, and prominent dorsal epidural fat result in moderate spinal stenosis and moderate left and mild right  neural foraminal stenosis, overall mildly progressed. L3-4: Circumferential disc bulging and mild facet hypertrophy result in mild bilateral lateral recess and mild bilateral neural foraminal stenosis, not significantly changed. L4-5: Prior right laminectomy again noted. Disc bulging, endplate spurring, small right paracentral disc protrusion, and moderate facet arthrosis result in mild right lateral recess and moderate right greater than left neural foraminal stenosis, not significantly changed. No spinal stenosis. L5-S1: Moderate facet arthrosis without disc herniation or stenosis, unchanged. IMPRESSION: 1. Progressive disc degeneration at L2-3 with moderate spinal stenosis and moderate left neural foraminal stenosis. 2. Unchanged disc and facet degeneration elsewhere with up to moderate neural foraminal stenosis as above. Electronically Signed   By: Sebastian Ache M.D.   On: 06/02/2016 18:46     Not all labs, radiology exams or other studies done during hospitalization come through on my EPIC note; however they are reviewed by me.    Assessment and Plan  No problem-specific Assessment & Plan notes found for this encounter.   Randon Goldsmith. Lyn Hollingshead, MD

## 2018-07-29 NOTE — Progress Notes (Signed)
Location:      Place of Service:     Margit Hanks, MD  Patient Care Team: Margit Hanks, MD as PCP - General (Internal Medicine)  Extended Emergency Contact Information Primary Emergency Contact: Krogstad,Glenn Address: [address]          [place], [place] [address] D[place] of [place] Home Phone: [phone] Mobile Phone: [phone] Relation: Spouse Secondary Emergency Contact: Kruge,Heather Mobile Phone: [phone] Relation: Daughter    Allergies: Patient has no known allergies.  No chief complaint on file.   HPI: Patient is 68 y.o. female who   Past Medical History:  Diagnosis Date  . Abscess of left foot   . Atrial fibrillation with RVR (HCC)   . Carotid artery stenosis 2018   Left 40-59%, Right 1-39%  . CHF (congestive heart failure) (HCC)   . Chronic pain syndrome   . Chronic prescription opiate use   . Degeneration of cervical intervertebral disc   . Degeneration of lumbar or lumbosacral intervertebral disc   . Depressive disorder   . DVT (deep venous thrombosis) (HCC)   . Hyperlipidemia   . Hypertension   . OSA (obstructive sleep apnea)    No CPAP  . Paroxysmal atrial fibrillation (HCC)   . Pneumonia   . Postlaminectomy syndrome, lumbar region   . Sepsis (HCC)   . Status post laparoscopic cholecystectomy   . Thyroid disease   . Transfusion history     Past Surgical History:  Procedure Laterality Date  . BACK SURGERY    . BREAST LUMPECTOMY    . CARDIAC CATHETERIZATION    . CARDIAC CATHETERIZATION    . CARDIAC PACEMAKER PLACEMENT    . COLONOSCOPY WITH ESOPHAGOGASTRODUODENOSCOPY (EGD)    . COLONOSCOPY, ESOPHAGOGASTRODUODENOSCOPY (EGD) AND ESOPHAGEAL DILATION    . EVACUATION BREAST HEMATOMA    . LUMBAR MICRODISCECTOMY    . TONSILLECTOMY      Allergies as of 07/28/2018   No Known Allergies     Medication List       Accurate as of July 28, 2018 11:59 PM. Always use your most recent med list.        acetaminophen  500 MG tablet Commonly known as:  TYLENOL Take 1,000 mg by mouth every 6 (six) hours as needed.   aspirin 81 MG chewable tablet Chew 1 tablet (81 mg total) by mouth daily.   atorvastatin 20 MG tablet Commonly known as:  LIPITOR Take 1 tablet (20 mg total) by mouth daily. 1 Tablet (20 mg total) by mouth daily at 6p   desvenlafaxine 50 MG 24 hr tablet Commonly known as:  PRISTIQ Take 2 tablets (100 mg total) by mouth at bedtime.   diltiazem 60 MG tablet Commonly known as:  CARDIZEM Take 60 mg by mouth every 6 (six) hours.   docusate sodium 100 MG capsule Commonly known as:  COLACE Take 100 mg by mouth 2 (two) times daily.   gabapentin 800 MG tablet Commonly known as:  NEURONTIN Take 800 mg by mouth 3 (three) times daily.   levothyroxine 150 MCG tablet Commonly known as:  SYNTHROID, LEVOTHROID Take 1 tablet (150 mcg total) by mouth daily before breakfast.   metoprolol tartrate 25 MG tablet Commonly known as:  LOPRESSOR Take 1 tablet (25 mg total) by mouth 2 (two) times daily.   morphine 15 MG 12 hr tablet Commonly known as:  MS CONTIN Take 1 tablet (15 mg total) by mouth every 8 (eight) hours.   Oxycodone HCl  10 MG Tabs Take 1 tablet (10 mg total) by mouth every 4 (four) hours as needed (for only 2 weeks).   rivaroxaban 20 MG Tabs tablet Commonly known as:  XARELTO Take 1 tablet (20 mg total) by mouth daily with supper.   tiZANidine 4 MG tablet Commonly known as:  ZANAFLEX Take 4 mg by mouth at bedtime.       No orders of the defined types were placed in this encounter.    There is no immunization history on file for this patient.  Social History   Tobacco Use  . Smoking status: Former Smoker    Packs/day: 1.00    Years: 40.00    Pack years: 40.00  . Smokeless tobacco: Former Engineer, water Use Topics  . Alcohol use: Not on file    Review of Systems  DATA OBTAINED: from patient, nurse, medical record, family member GENERAL:  no fevers, fatigue,  appetite changes SKIN: No itching, rash HEENT: No complaint RESPIRATORY: No cough, wheezing, SOB CARDIAC: No chest pain, palpitations, lower extremity edema  GI: No abdominal pain, No N/V/D or constipation, No heartburn or reflux  GU: No dysuria, frequency or urgency, or incontinence  MUSCULOSKELETAL: No unrelieved bone/joint pain NEUROLOGIC: No headache, dizziness  PSYCHIATRIC: No overt anxiety or sadness  There were no vitals filed for this visit. There is no height or weight on file to calculate BMI. Physical Exam  GENERAL APPEARANCE: Alert, conversant, No acute distress  SKIN: No diaphoresis rash HEENT: Unremarkable RESPIRATORY: Breathing is even, unlabored. Lung sounds are clear   CARDIOVASCULAR: Heart RRR no murmurs, rubs or gallops. No peripheral edema  GASTROINTESTINAL: Abdomen is soft, non-tender, not distended w/ normal bowel sounds.  GENITOURINARY: Bladder non tender, not distended  MUSCULOSKELETAL: No abnormal joints or musculature NEUROLOGIC: Cranial nerves 2-12 grossly intact. Moves all extremities PSYCHIATRIC: Mood and affect appropriate to situation, no behavioral issues  Patient Active Problem List   Diagnosis Date Noted  . Bimalleolar fracture of right ankle 07/20/2018  . Paroxysmal atrial fibrillation (HCC) 07/20/2018  . UTI (urinary tract infection) 07/20/2018  . Depression 07/20/2018  . Impacted gallstone of gallbladder 03/29/2018  . Cellulitis and abscess of toe of left foot 03/29/2018  . Atrial fibrillation with RVR (HCC) 03/29/2018  . Hypertension 03/29/2018  . Hyperlipidemia 03/29/2018  . Hypothyroidism 03/29/2018  . Chronic pain syndrome 03/29/2018    CMP     Component Value Date/Time   NA 141 07/23/2018   K 4.0 07/23/2018   BUN 16 07/23/2018   CREATININE 0.7 07/23/2018   Recent Labs    03/31/18 07/18/18 07/23/18  NA 141 144 141  K 4.2 4.3 4.0  BUN 12 10 16   CREATININE 0.7 0.5 0.7   No results for input(s): AST, ALT, ALKPHOS, BILITOT,  PROT, ALBUMIN in the last 8760 hours. Recent Labs    03/31/18 07/18/18 07/23/18  WBC 5.7 6.0 7.5  HGB 11.7* 12.3 11.8*  HCT 34* 36 35*  PLT 169 189 236   No results for input(s): CHOL, LDLCALC, TRIG in the last 8760 hours.  Invalid input(s): HCL No results found for: MICROALBUR No results found for: TSH No results found for: HGBA1C No results found for: CHOL, HDL, LDLCALC, LDLDIRECT, TRIG, CHOLHDL  Significant Diagnostic Results in last 30 days:  No results found.  Assessment and Plan  No problem-specific Assessment & Plan notes found for this encounter.   Labs/tests ordered:    Merrilee Seashore, MD   This encounter was created in error -  please disregard.

## 2018-07-29 NOTE — Progress Notes (Signed)
Location:   [facility] Service:   SNF  PCP: Margit Hanks, MD Patient Care Team: Margit Hanks, MD as PCP - General (Internal Medicine)  Extended Emergency Contact Information Primary Emergency Contact: Anglin,Glenn Address: [address]          [place], [place] [address] [place] of [place] Home Phone: [phone] Mobile Phone: [phone] Relation: Spouse Secondary Emergency Contact: Kruge,Heather Mobile Phone: [phone] Relation: Daughter  No Known Allergies  Chief Complaint  Patient presents with  . Discharge Note    HPI: Patient is a 68 year old female with hypothyroidism, chronic pain syndrome, essential hypertension, paroxysmal atrial fib on Xarelto for the last 4 years, CHF, cardiac pacemaker, recently diagnosed left breast cancer, planning to start radiation therapy, who presented to [facility] emergency department status post fall and found to have right ankle fracture.  In the emergency department patient was found to have heart rate of 110, blood pressure 138/108 and respiratory rate 20, patient denies shortness of breath or chest pain.  EKG was consistent with atrial flutter with variable AV block but no new changes.  X-ray showed right ankle bimalleolar fracture displaced with 6 to 7 mm of lateral talar subluxation.  Patient was admitted to [facility] from 2/29-3/5 where she underwent ORIF on 3/2.  During hospitalization she had an episode of atrial flutter with RVR with low blood pressure and cardiology was consulted and Cardizem was added and losartan was dropped.  On 3/4 patient complained of increased urination and UA showed trace leukocyte Estrace and patient was started on Rocephin and then transition to [facility]  Patient was admitted to skilled nursing facility for OT/PT and is now ready to be discharged home.     Past Medical History:  Diagnosis Date  . Abscess of left foot   . Atrial fibrillation  with RVR (HCC)   . Carotid artery stenosis 2018   Left 40-59%, Right 1-39%  . CHF (congestive heart failure) (HCC)   . Chronic pain syndrome   . Chronic prescription opiate use   . Degeneration of cervical intervertebral disc   . Degeneration of lumbar or lumbosacral intervertebral disc   . Depressive disorder   . DVT (deep venous thrombosis) (HCC)   . Hyperlipidemia   . Hypertension   . OSA (obstructive sleep apnea)    No CPAP  . Paroxysmal atrial fibrillation (HCC)   . Pneumonia   . Postlaminectomy syndrome, lumbar region   . Sepsis (HCC)   . Status post laparoscopic cholecystectomy   . Thyroid disease   . Transfusion history     Past Surgical History:  Procedure Laterality Date  . BACK SURGERY    . BREAST LUMPECTOMY    . CARDIAC CATHETERIZATION    . CARDIAC CATHETERIZATION    . CARDIAC PACEMAKER PLACEMENT    . COLONOSCOPY WITH ESOPHAGOGASTRODUODENOSCOPY (EGD)    . COLONOSCOPY, ESOPHAGOGASTRODUODENOSCOPY (EGD) AND ESOPHAGEAL DILATION    . EVACUATION BREAST HEMATOMA    . LUMBAR MICRODISCECTOMY    . TONSILLECTOMY       reports that she has quit smoking. She has a 40.00 pack-year smoking history. She has quit using smokeless tobacco. No history on file for alcohol and drug. Social History   Socioeconomic History  . Marital status: Married    Spouse name: Not on file  . Number of children: Not on file  . Years of education: Not on file  . Highest education level: Not  on file  Occupational History  . Not on file  Social Needs  . Financial resource strain: Not on file  . Food insecurity:    Worry: Not on file    Inability: Not on file  . Transportation needs:    Medical: Not on file    Non-medical: Not on file  Tobacco Use  . Smoking status: Former Smoker    Packs/day: 1.00    Years: 40.00    Pack years: 40.00  . Smokeless tobacco: Former Engineer, water and Sexual Activity  . Alcohol use: Not on file  . Drug use: Not on file  . Sexual activity: Not on  file  Lifestyle  . Physical activity:    Days per week: Not on file    Minutes per session: Not on file  . Stress: Not on file  Relationships  . Social connections:    Talks on phone: Not on file    Gets together: Not on file    Attends religious service: Not on file    Active member of club or organization: Not on file    Attends meetings of clubs or organizations: Not on file    Relationship status: Not on file  . Intimate partner violence:    Fear of current or ex partner: Not on file    Emotionally abused: Not on file    Physically abused: Not on file    Forced sexual activity: Not on file  Other Topics Concern  . Not on file  Social History Narrative  . Not on file    Pertinent  Health Maintenance Due  Topic Date Due  . COLONOSCOPY  02/24/2000  . MAMMOGRAM  01/08/2009  . DEXA SCAN  02/24/2015  . PNA vac Low Risk Adult (1 of 2 - PCV13) 02/24/2015  . INFLUENZA VACCINE  12/12/2017    Medications: Allergies as of 07/29/2018   No Known Allergies     Medication List       Accurate as of July 29, 2018 11:59 PM. Always use your most recent med list.        acetaminophen 500 MG tablet Commonly known as:  TYLENOL Take 1,000 mg by mouth every 6 (six) hours as needed.   aspirin 81 MG chewable tablet Chew 1 tablet (81 mg total) by mouth daily.   atorvastatin 20 MG tablet Commonly known as:  LIPITOR Take 1 tablet (20 mg total) by mouth daily. 1 Tablet (20 mg total) by mouth daily at 6p   desvenlafaxine 50 MG 24 hr tablet Commonly known as:  PRISTIQ Take 2 tablets (100 mg total) by mouth at bedtime.   diltiazem 60 MG tablet Commonly known as:  CARDIZEM Take 1 tablet (60 mg total) by mouth every 6 (six) hours.   docusate sodium 100 MG capsule Commonly known as:  COLACE Take 100 mg by mouth 2 (two) times daily.   gabapentin 800 MG tablet Commonly known as:  NEURONTIN Take 1 tablet (800 mg total) by mouth 3 (three) times daily.   levothyroxine 150 MCG  tablet Commonly known as:  SYNTHROID, LEVOTHROID Take 1 tablet (150 mcg total) by mouth daily before breakfast.   metoprolol tartrate 25 MG tablet Commonly known as:  LOPRESSOR Take 1 tablet (25 mg total) by mouth 2 (two) times daily.   morphine 15 MG 12 hr tablet Commonly known as:  MS CONTIN Take 1 tablet (15 mg total) by mouth every 8 (eight) hours.   Oxycodone HCl 10 MG Tabs  Take 1 tablet (10 mg total) by mouth every 6 (six) hours as needed.   rivaroxaban 20 MG Tabs tablet Commonly known as:  XARELTO Take 1 tablet (20 mg total) by mouth daily with supper.   tiZANidine 4 MG tablet Commonly known as:  ZANAFLEX Take 1 tablet (4 mg total) by mouth at bedtime.        Vitals:   07/29/18 1502  BP: 123/60  Pulse: 85  Resp: 18  Temp: (!) 97.1 F (36.2 C)  Height: 5\' 4"  (1.626 m)   Body mass index is 30.9 kg/m.  Physical Exam  GENERAL APPEARANCE: Alert, conversant. No acute distress.  HEENT: Unremarkable. RESPIRATORY: Breathing is even, unlabored. Lung sounds are clear   CARDIOVASCULAR: Heart RRR no murmurs, rubs or gallops. No peripheral edema.  GASTROINTESTINAL: Abdomen is soft, non-tender, not distended w/ normal bowel sounds.  NEUROLOGIC: Cranial nerves 2-12 grossly intact. Moves all extremities   Labs reviewed: Basic Metabolic Panel: Recent Labs    03/31/18 07/18/18 07/23/18  NA 141 144 141  K 4.2 4.3 4.0  BUN 12 10 16   CREATININE 0.7 0.5 0.7   No results found for: [facility] Liver Function Tests: No results for input(s): AST, ALT, ALKPHOS, BILITOT, PROT, ALBUMIN in the last 8760 hours. No results for input(s): LIPASE, AMYLASE in the last 8760 hours. No results for input(s): AMMONIA in the last 8760 hours. CBC: Recent Labs    03/31/18 07/18/18 07/23/18  WBC 5.7 6.0 7.5  HGB 11.7* 12.3 11.8*  HCT 34* 36 35*  PLT 169 189 236   Lipid No results for input(s): CHOL, HDL, LDLCALC, TRIG in the last 8760 hours. Cardiac Enzymes: No results for  input(s): CKTOTAL, CKMB, CKMBINDEX, TROPONINI in the last 8760 hours. BNP: No results for input(s): BNP in the last 8760 hours. CBG: No results for input(s): GLUCAP in the last 8760 hours.  Procedures and Imaging Studies During Stay: No results found.  Assessment/Plan:   Closed bimalleolar fracture of right ankle with routine healing, subsequent encounter  Atrial fibrillation with RVR (HCC)  Paroxysmal atrial fibrillation (HCC)  Hypothyroidism due to acquired atrophy of thyroid  Essential hypertension  Acute cystitis without hematuria  Hyperlipidemia, unspecified hyperlipidemia type  Depression, unspecified depression type   Patient is being discharged with the following home health services: OT/PT  Patient is being discharged with the following durable medical equipment: None  Patient has been advised to f/u with their PCP in 1-2 weeks to bring them up to date on their rehab stay.  Social services at facility was responsible for arranging this appointment.  Pt was provided with a 30 day supply of prescriptions for medications and refills must be obtained from their PCP.  For controlled substances, a more limited supply may be provided adequate until PCP appointment only.  Medications have been reconciled.  Time spent greater than 30 minutes;> 50% of time with patient was spent reviewing records, labs, tests and studies, counseling and developing plan of care  Merrilee Seashore, MD

## 2018-07-29 NOTE — Progress Notes (Signed)
Location[facility] Room Number: [phone] Place of Service:  SNF ([phone])  PCP: Margit Hanks, MD Patient Care Team: Margit Hanks, MD as PCP - General (Internal Medicine)  Extended Emergency Contact Information Primary Emergency Contact: Paradiso,Glenn Address: [address]          [place], [place] [address] [place] of [place] Home Phone: [phone] Mobile Phone: [phone] Relation: Spouse Secondary Emergency Contact: Kruge,Heather Mobile Phone: [phone] Relation: Daughter  No Known Allergies  Chief Complaint  Patient presents with  . Acute Visit    HPI:  68 y.o. female      Past Medical History:  Diagnosis Date  . Abscess of left foot   . Atrial fibrillation with RVR (HCC)   . Carotid artery stenosis 2018   Left 40-59%, Right 1-39%  . CHF (congestive heart failure) (HCC)   . Chronic pain syndrome   . Chronic prescription opiate use   . Degeneration of cervical intervertebral disc   . Degeneration of lumbar or lumbosacral intervertebral disc   . Depressive disorder   . DVT (deep venous thrombosis) (HCC)   . Hyperlipidemia   . Hypertension   . OSA (obstructive sleep apnea)    No CPAP  . Paroxysmal atrial fibrillation (HCC)   . Pneumonia   . Postlaminectomy syndrome, lumbar region   . Sepsis (HCC)   . Status post laparoscopic cholecystectomy   . Thyroid disease   . Transfusion history     Past Surgical History:  Procedure Laterality Date  . BACK SURGERY    . BREAST LUMPECTOMY    . CARDIAC CATHETERIZATION    . CARDIAC CATHETERIZATION    . CARDIAC PACEMAKER PLACEMENT    . COLONOSCOPY WITH ESOPHAGOGASTRODUODENOSCOPY (EGD)    . COLONOSCOPY, ESOPHAGOGASTRODUODENOSCOPY (EGD) AND ESOPHAGEAL DILATION    . EVACUATION BREAST HEMATOMA    . LUMBAR MICRODISCECTOMY    . TONSILLECTOMY       reports that she has quit smoking. She has a 40.00 pack-year smoking history. She has quit using smokeless tobacco. No  history on file for alcohol and drug. Social History   Socioeconomic History  . Marital status: Married    Spouse name: Not on file  . Number of children: Not on file  . Years of education: Not on file  . Highest education level: Not on file  Occupational History  . Not on file  Social Needs  . Financial resource strain: Not on file  . Food insecurity:    Worry: Not on file    Inability: Not on file  . Transportation needs:    Medical: Not on file    Non-medical: Not on file  Tobacco Use  . Smoking status: Former Smoker    Packs/day: 1.00    Years: 40.00    Pack years: 40.00  . Smokeless tobacco: Former Engineer, water and Sexual Activity  . Alcohol use: Not on file  . Drug use: Not on file  . Sexual activity: Not on file  Lifestyle  . Physical activity:    Days per week: Not on file    Minutes per session: Not on file  . Stress: Not on file  Relationships  . Social connections:    Talks on phone: Not on file    Gets together: Not on file    Attends religious service: Not on file    Active member of club or organization: Not on file    Attends meetings  of clubs or organizations: Not on file    Relationship status: Not on file  . Intimate partner violence:    Fear of current or ex partner: Not on file    Emotionally abused: Not on file    Physically abused: Not on file    Forced sexual activity: Not on file  Other Topics Concern  . Not on file  Social History Narrative  . Not on file    Pertinent  Health Maintenance Due  Topic Date Due  . COLONOSCOPY  02/24/2000  . MAMMOGRAM  01/08/2009  . DEXA SCAN  02/24/2015  . PNA vac Low Risk Adult (1 of 2 - PCV13) 02/24/2015  . INFLUENZA VACCINE  12/12/2017    Medications: Allergies as of 07/28/2018   No Known Allergies     Medication List       Accurate as of July 28, 2018 11:59 PM. Always use your most recent med list.        acetaminophen 500 MG tablet Commonly known as:  TYLENOL Take 1,000 mg by  mouth every 6 (six) hours as needed.   aspirin 81 MG chewable tablet Chew 1 tablet (81 mg total) by mouth daily.   atorvastatin 20 MG tablet Commonly known as:  LIPITOR Take 1 tablet (20 mg total) by mouth daily. 1 Tablet (20 mg total) by mouth daily at 6p   desvenlafaxine 50 MG 24 hr tablet Commonly known as:  PRISTIQ Take 2 tablets (100 mg total) by mouth at bedtime.   diltiazem 60 MG tablet Commonly known as:  CARDIZEM Take 60 mg by mouth every 6 (six) hours.   docusate sodium 100 MG capsule Commonly known as:  COLACE Take 100 mg by mouth 2 (two) times daily.   gabapentin 800 MG tablet Commonly known as:  NEURONTIN Take 800 mg by mouth 3 (three) times daily.   levothyroxine 150 MCG tablet Commonly known as:  SYNTHROID, LEVOTHROID Take 1 tablet (150 mcg total) by mouth daily before breakfast.   metoprolol tartrate 25 MG tablet Commonly known as:  LOPRESSOR Take 1 tablet (25 mg total) by mouth 2 (two) times daily.   morphine 15 MG 12 hr tablet Commonly known as:  MS CONTIN Take 1 tablet (15 mg total) by mouth every 8 (eight) hours.   Oxycodone HCl 10 MG Tabs Take 1 tablet (10 mg total) by mouth every 4 (four) hours as needed (for only 2 weeks).   rivaroxaban 20 MG Tabs tablet Commonly known as:  XARELTO Take 1 tablet (20 mg total) by mouth daily with supper.   tiZANidine 4 MG tablet Commonly known as:  ZANAFLEX Take 4 mg by mouth at bedtime.        There were no vitals filed for this visit. There is no height or weight on file to calculate BMI.  Physical Exam  GENERAL APPEARANCE: Alert, conversant. No acute distress.  HEENT: Unremarkable. RESPIRATORY: Breathing is even, unlabored. Lung sounds are clear   CARDIOVASCULAR: Heart RRR no murmurs, rubs or gallops. No peripheral edema.  GASTROINTESTINAL: Abdomen is soft, non-tender, not distended w/ normal bowel sounds.  NEUROLOGIC: Cranial nerves 2-12 grossly intact. Moves all extremities   Labs reviewed:  Basic Metabolic Panel: Recent Labs    03/31/18 07/18/18 07/23/18  NA 141 144 141  K 4.2 4.3 4.0  BUN 12 10 16   CREATININE 0.7 0.5 0.7   No results found for: MICROALBUR Liver Function Tests: No results for input(s): AST, ALT, ALKPHOS, BILITOT, PROT, ALBUMIN in  the last 8760 hours. No results for input(s): LIPASE, AMYLASE in the last 8760 hours. No results for input(s): AMMONIA in the last 8760 hours. CBC: Recent Labs    03/31/18 07/18/18 07/23/18  WBC 5.7 6.0 7.5  HGB 11.7* 12.3 11.8*  HCT 34* 36 35*  PLT 169 189 236   Lipid No results for input(s): CHOL, HDL, LDLCALC, TRIG in the last 8760 hours. Cardiac Enzymes: No results for input(s): CKTOTAL, CKMB, CKMBINDEX, TROPONINI in the last 8760 hours. BNP: No results for input(s): BNP in the last 8760 hours. CBG: No results for input(s): GLUCAP in the last 8760 hours.  Procedures and Imaging Studies During Stay: No results found.  Assessment/Plan:   No diagnosis found.   Patient is being discharged with the following home health services:    Patient is being discharged with the following durable medical equipment:    Patient has been advised to f/u with their PCP in 1-2 weeks to bring them up to date on their rehab stay.  Social services at facility was responsible for arranging this appointment.  Pt was provided with a 30 day supply of prescriptions for medications and refills must be obtained from their PCP.  For controlled substances, a more limited supply may be provided adequate until PCP appointment only.  Future labs/tests needed:   Merrilee Seashore, MD  This encounter was created in error - please disregard.

## 2018-08-02 ENCOUNTER — Other Ambulatory Visit: Payer: Self-pay | Admitting: Internal Medicine

## 2018-08-02 ENCOUNTER — Encounter: Payer: Self-pay | Admitting: Internal Medicine

## 2018-09-04 ENCOUNTER — Other Ambulatory Visit: Payer: Self-pay | Admitting: Internal Medicine
